# Patient Record
Sex: Male | Born: 1975 | Race: Black or African American | Hispanic: No | Marital: Single | Smoking: Never smoker
Health system: Southern US, Community
[De-identification: ages and names within clinical notes are randomized; demographics above are authoritative.]

## PROBLEM LIST (undated history)

## (undated) DIAGNOSIS — R002 Palpitations: Secondary | ICD-10-CM

## (undated) DIAGNOSIS — F29 Unspecified psychosis not due to a substance or known physiological condition: Secondary | ICD-10-CM

## (undated) DIAGNOSIS — G8929 Other chronic pain: Secondary | ICD-10-CM

## (undated) DIAGNOSIS — F410 Panic disorder [episodic paroxysmal anxiety] without agoraphobia: Secondary | ICD-10-CM

## (undated) DIAGNOSIS — Z6841 Body Mass Index (BMI) 40.0 and over, adult: Secondary | ICD-10-CM

## (undated) DIAGNOSIS — K219 Gastro-esophageal reflux disease without esophagitis: Secondary | ICD-10-CM

## (undated) DIAGNOSIS — J45909 Unspecified asthma, uncomplicated: Secondary | ICD-10-CM

## (undated) DIAGNOSIS — M109 Gout, unspecified: Secondary | ICD-10-CM

## (undated) DIAGNOSIS — F419 Anxiety disorder, unspecified: Secondary | ICD-10-CM

## (undated) DIAGNOSIS — F329 Major depressive disorder, single episode, unspecified: Secondary | ICD-10-CM

## (undated) DIAGNOSIS — L0390 Cellulitis, unspecified: Secondary | ICD-10-CM

## (undated) DIAGNOSIS — F259 Schizoaffective disorder, unspecified: Secondary | ICD-10-CM

## (undated) DIAGNOSIS — F1190 Opioid use, unspecified, uncomplicated: Secondary | ICD-10-CM

## (undated) HISTORY — DX: Gastro-esophageal reflux disease without esophagitis: K21.9

## (undated) HISTORY — DX: Major depressive disorder, single episode, unspecified: F32.9

## (undated) HISTORY — DX: Cellulitis, unspecified: L03.90

## (undated) HISTORY — PX: CHOLECYSTECTOMY: SHX55

## (undated) HISTORY — PX: CARPAL TUNNEL RELEASE: SHX101

## (undated) HISTORY — DX: Panic disorder (episodic paroxysmal anxiety): F41.0

## (undated) HISTORY — DX: Other chronic pain: G89.29

## (undated) HISTORY — DX: Body Mass Index (BMI) 40.0 and over, adult: Z684

## (undated) HISTORY — DX: Palpitations: R00.2

## (undated) HISTORY — DX: Schizoaffective disorder, unspecified: F25.9

---

## 1998-11-23 ENCOUNTER — Emergency Department (HOSPITAL_COMMUNITY): Admission: EM | Admit: 1998-11-23 | Discharge: 1998-11-23 | Payer: Self-pay | Admitting: Emergency Medicine

## 2002-12-21 ENCOUNTER — Emergency Department (HOSPITAL_COMMUNITY): Admission: EM | Admit: 2002-12-21 | Discharge: 2002-12-21 | Payer: Self-pay | Admitting: Emergency Medicine

## 2007-04-06 ENCOUNTER — Emergency Department (HOSPITAL_COMMUNITY): Admission: EM | Admit: 2007-04-06 | Discharge: 2007-04-06 | Payer: Self-pay | Admitting: Emergency Medicine

## 2009-07-10 ENCOUNTER — Emergency Department: Payer: Self-pay | Admitting: Emergency Medicine

## 2011-03-18 LAB — CBC
Hemoglobin: 16.1
RBC: 5.26
WBC: 4.9

## 2011-03-18 LAB — I-STAT 8, (EC8 V) (CONVERTED LAB)
Acid-Base Excess: 2
BUN: 8
Chloride: 104
HCT: 50
Hemoglobin: 17
Operator id: 198171
Potassium: 3.7
Sodium: 138

## 2011-03-18 LAB — POCT I-STAT CREATININE: Creatinine, Ser: 0.8

## 2011-03-18 LAB — DIFFERENTIAL
Lymphs Abs: 1.8
Monocytes Absolute: 0.4
Monocytes Relative: 7
Neutro Abs: 2.4
Neutrophils Relative %: 49

## 2013-05-09 DIAGNOSIS — G5600 Carpal tunnel syndrome, unspecified upper limb: Secondary | ICD-10-CM | POA: Insufficient documentation

## 2013-06-05 ENCOUNTER — Emergency Department (HOSPITAL_COMMUNITY)
Admission: EM | Admit: 2013-06-05 | Discharge: 2013-06-05 | Disposition: A | Discharge status: Home / Self Care | Payer: 59 | Attending: Emergency Medicine | Admitting: Emergency Medicine

## 2013-06-05 ENCOUNTER — Encounter (HOSPITAL_COMMUNITY): Payer: Self-pay | Admitting: Emergency Medicine

## 2013-06-05 DIAGNOSIS — Z79899 Other long term (current) drug therapy: Secondary | ICD-10-CM | POA: Insufficient documentation

## 2013-06-05 DIAGNOSIS — M722 Plantar fascial fibromatosis: Secondary | ICD-10-CM

## 2013-06-05 DIAGNOSIS — F411 Generalized anxiety disorder: Secondary | ICD-10-CM | POA: Insufficient documentation

## 2013-06-05 DIAGNOSIS — J45909 Unspecified asthma, uncomplicated: Secondary | ICD-10-CM | POA: Insufficient documentation

## 2013-06-05 DIAGNOSIS — M79609 Pain in unspecified limb: Secondary | ICD-10-CM

## 2013-06-05 DIAGNOSIS — M7989 Other specified soft tissue disorders: Secondary | ICD-10-CM

## 2013-06-05 DIAGNOSIS — Z880 Allergy status to penicillin: Secondary | ICD-10-CM | POA: Insufficient documentation

## 2013-06-05 DIAGNOSIS — M109 Gout, unspecified: Secondary | ICD-10-CM | POA: Insufficient documentation

## 2013-06-05 HISTORY — DX: Anxiety disorder, unspecified: F41.9

## 2013-06-05 HISTORY — DX: Gout, unspecified: M10.9

## 2013-06-05 HISTORY — DX: Unspecified asthma, uncomplicated: J45.909

## 2013-06-05 MED ORDER — COLCHICINE 0.6 MG PO TABS: 0.6000 mg | ORAL_TABLET | Freq: Every day | ORAL | Status: DC

## 2013-06-05 MED ORDER — MELOXICAM 15 MG PO TABS: 15.0000 mg | ORAL_TABLET | Freq: Every day | ORAL | Status: DC

## 2013-06-05 MED ORDER — KETOROLAC TROMETHAMINE 60 MG/2ML IM SOLN: 60.0000 mg | Freq: Once | INTRAMUSCULAR | Status: DC

## 2013-06-05 MED ORDER — OXYCODONE-ACETAMINOPHEN 5-325 MG PO TABS
1.0000 | ORAL_TABLET | Freq: Once | ORAL | Status: AC
  Administered 2013-06-05: 1 via ORAL
  Filled 2013-06-05: qty 1

## 2013-06-05 NOTE — ED Notes (Signed)
Per pt sts right foot pain and right calf pain. Recent surgery. Pt swelling to foot and redness. Sent here by doctor for possible blood clot.

## 2013-06-05 NOTE — ED Notes (Signed)
Mayra Reel RN spoke to and discharged pt . sts pt sts pain was 4/10.

## 2013-06-05 NOTE — Progress Notes (Signed)
VASCULAR LAB PRELIMINARY  PRELIMINARY  PRELIMINARY  PRELIMINARY  Right lower extremity venous duplex completed.    Preliminary report:  Right:  No evidence of DVT, superficial thrombosis, or Baker's cyst.  P[place]irie Stenberg, RVS 06/05/2013, 5:34 PM

## 2013-06-05 NOTE — ED Provider Notes (Signed)
CSN: [id number]     Arrival date & time 06/05/13  1629 History   First MD Initiated Contact with Patient 06/05/13 2105     Chief Complaint  Patient presents with  . Leg Pain  . Foot Pain   HPI  History provided by the patient. Patient is a 37 year old male with a history of gout and anxiety who presents with complaints of pain to his right foot. Patient reports having increasing right foot pain for the past few days. He is traveling here from [place]. States he has had similar pain in the right foot previously and diagnosed with gout. Patient however was worried for possible clot in the leg. Denies any severe swelling of the lower leg or calf area. Pain does radiate some into the calf. It is worse with movements and walking. Patient states he's been using Norco which has helped with the pain he recently ran out. Patient also states he was given colchicine in the past for gout which seemed to help with those symptoms at that time. Denies any other aggravating or alleviating factors. No other associated symptoms. Denies any chest pain or shortness of breath. No fever, chills or sweats.   Past Medical History  Diagnosis Date  . Gout   . Anxiety   . Asthma    History reviewed. No pertinent past surgical history. History reviewed. No pertinent family history. History  Substance Use Topics  . Smoking status: Unknown If Ever Smoked  . Smokeless tobacco: Not on file  . Alcohol Use: No    Review of Systems  Constitutional: Negative for fever, chills and diaphoresis.  Respiratory: Negative for shortness of breath.   Cardiovascular: Negative for chest pain.  Neurological: Negative for weakness and numbness.  All other systems reviewed and are negative.    Allergies  Penicillins  Home Medications   Current Outpatient Rx  Name  Route  Sig  Dispense  Refill  . albuterol (PROVENTIL HFA;VENTOLIN HFA) 108 (90 BASE) MCG/ACT inhaler   Inhalation   Inhale 1 puff into the lungs every 6 (six)  hours as needed for wheezing or shortness of breath.         Marland Kitchen albuterol (PROVENTIL) (2.5 MG/3ML) 0.083% nebulizer solution   Nebulization   Take 2.5 mg by nebulization every 6 (six) hours as needed for wheezing or shortness of breath.         . alprazolam (XANAX) 2 MG tablet   Oral   Take 2 mg by mouth 3 (three) times daily.         . colchicine 0.6 MG tablet   Oral   Take 0.6 mg by mouth daily.         . diazepam (VALIUM) 10 MG tablet   Oral   Take 20 mg by mouth every 6 (six) hours as needed for anxiety.         Marland Kitchen FLUoxetine (PROZAC) 20 MG capsule   Oral   Take 20 mg by mouth daily.         Marland Kitchen HYDROcodone-acetaminophen (NORCO/VICODIN) 5-325 MG per tablet   Oral   Take 1 tablet by mouth every 6 (six) hours as needed for moderate pain.         . indomethacin (INDOCIN) 25 MG capsule   Oral   Take 25 mg by mouth 2 (two) times daily with a meal.         . ziprasidone (GEODON) 80 MG capsule   Oral   Take 80 mg by  mouth at bedtime.          BP 121/78  Pulse 70  Temp(Src) 98.4 F (36.9 C) (Oral)  Resp 18  Ht 6\' 1"  (1.854 m)  Wt 320 lb (145.151 kg)  BMI 42.23 kg/m2  SpO2 97% Physical Exam  Nursing note and vitals reviewed. Constitutional: He is oriented to person, place, and time. He appears well-developed and well-nourished. No distress.  HENT:  Head: Normocephalic.  Cardiovascular: Normal rate and regular rhythm.   Pulmonary/Chest: Effort normal and breath sounds normal. No respiratory distress. He has no wheezes. He has no rales.  Musculoskeletal:  Tenderness along the plantar surface of the right foot greatest near the heel. There is mild swelling. No erythema or increased warmth. Normal dorsal pedal pulses. Normal sensations in the feet. No significant pain or swelling along the calf or lower leg.  Neurological: He is alert and oriented to person, place, and time.  Skin: Skin is warm.  Psychiatric: He has a normal mood and affect. His behavior is  normal.    ED Course  Procedures   COORDINATION OF CARE:  Nursing notes reviewed. Vital signs reviewed. Initial pt interview and examination performed.   9:28 PM-patient seen and evaluated. Patient well appearing does not appear in severe pain or acute distress. Patient has tenderness along the right plantar surface with mild swelling. No erythema of the foot. No significant calf pain or swelling. Clinically this has not appear worrisome for DVT. Vascular Doppler also without signs of DVT. No increased warmth. Normal pulses and sensation. He reports previously being told he had gout in the foot. At this time I suspect plantar fasciitis. Discussed recommendations for anti-inflammatory treatments. Patient began to request Norco and stated that he needed to take this to help with the rest of his drive back to [place]. I told him that the appropriate treatment is unknown anti-inflammatory steroid pain medicine and I would not prescribe Norco especially if he plan to drive while taking the medicine. Patient became upset and became upset about the long wait in the emergency department and that I would not give him narcotic pain medicine.  Charge nurse was called at patient request to speak with patient. He will still be discharged with prescription for Motrin.  Treatment plan initiated: Medications  oxyCODONE-acetaminophen (PERCOCET/ROXICET) 5-325 MG per tablet 1 tablet (1 tablet Oral Given 06/05/13 1651)      MDM   1. Plantar fasciitis of right foot        Angus Seller, PA-C 06/06/13 0015

## 2013-06-06 NOTE — ED Provider Notes (Signed)
Medical screening examination/treatment/procedure(s) were performed by non-physician practitioner and as supervising physician I was immediately available for consultation/collaboration.  EKG Interpretation   None        Leisha Trinkle R. Thoren Hosang, MD 06/06/13 2301 

## 2016-06-06 ENCOUNTER — Emergency Department: Payer: 59

## 2016-06-06 ENCOUNTER — Encounter: Payer: Self-pay | Admitting: Emergency Medicine

## 2016-06-06 ENCOUNTER — Emergency Department
Admission: EM | Admit: 2016-06-06 | Discharge: 2016-06-06 | Disposition: A | Discharge status: Home / Self Care | Payer: 59 | Attending: Emergency Medicine | Admitting: Emergency Medicine

## 2016-06-06 DIAGNOSIS — R197 Diarrhea, unspecified: Secondary | ICD-10-CM | POA: Diagnosis not present

## 2016-06-06 DIAGNOSIS — R112 Nausea with vomiting, unspecified: Secondary | ICD-10-CM

## 2016-06-06 DIAGNOSIS — Z79899 Other long term (current) drug therapy: Secondary | ICD-10-CM | POA: Insufficient documentation

## 2016-06-06 DIAGNOSIS — J45909 Unspecified asthma, uncomplicated: Secondary | ICD-10-CM | POA: Diagnosis not present

## 2016-06-06 DIAGNOSIS — E876 Hypokalemia: Secondary | ICD-10-CM | POA: Diagnosis not present

## 2016-06-06 DIAGNOSIS — R509 Fever, unspecified: Secondary | ICD-10-CM | POA: Diagnosis present

## 2016-06-06 DIAGNOSIS — K2960 Other gastritis without bleeding: Secondary | ICD-10-CM | POA: Insufficient documentation

## 2016-06-06 DIAGNOSIS — K2900 Acute gastritis without bleeding: Secondary | ICD-10-CM

## 2016-06-06 LAB — CBC
HEMATOCRIT: 47 % (ref 40.0–52.0)
HEMOGLOBIN: 16.1 g/dL (ref 13.0–18.0)
MCH: 29.9 pg (ref 26.0–34.0)
MCHC: 34.2 g/dL (ref 32.0–36.0)
MCV: 87.3 fL (ref 80.0–100.0)
Platelets: 253 10*3/uL (ref 150–440)
RBC: 5.38 MIL/uL (ref 4.40–5.90)
RDW: 13.4 % (ref 11.5–14.5)
WBC: 8.8 10*3/uL (ref 3.8–10.6)

## 2016-06-06 LAB — COMPREHENSIVE METABOLIC PANEL
ALK PHOS: 89 U/L (ref 38–126)
ALT: 56 U/L (ref 17–63)
ANION GAP: 8 (ref 5–15)
AST: 41 U/L (ref 15–41)
Albumin: 3.8 g/dL (ref 3.5–5.0)
BILIRUBIN TOTAL: 0.8 mg/dL (ref 0.3–1.2)
BUN: 16 mg/dL (ref 6–20)
CALCIUM: 8.3 mg/dL — AB (ref 8.9–10.3)
CO2: 27 mmol/L (ref 22–32)
CREATININE: 1.01 mg/dL (ref 0.61–1.24)
Chloride: 103 mmol/L (ref 101–111)
Glucose, Bld: 142 mg/dL — ABNORMAL HIGH (ref 65–99)
Potassium: 2.9 mmol/L — ABNORMAL LOW (ref 3.5–5.1)
SODIUM: 138 mmol/L (ref 135–145)
TOTAL PROTEIN: 7.3 g/dL (ref 6.5–8.1)

## 2016-06-06 LAB — LIPASE, BLOOD: LIPASE: 30 U/L (ref 11–51)

## 2016-06-06 LAB — INFLUENZA PANEL BY PCR (TYPE A & B)
INFLAPCR: NEGATIVE
Influenza B By PCR: NEGATIVE

## 2016-06-06 LAB — TROPONIN I

## 2016-06-06 MED ORDER — SODIUM CHLORIDE 0.9 % IV BOLUS (SEPSIS)
1000.0000 mL | Freq: Once | INTRAVENOUS | Status: AC
  Administered 2016-06-06: 1000 mL via INTRAVENOUS

## 2016-06-06 MED ORDER — POTASSIUM CHLORIDE IN NACL 20-0.9 MEQ/L-% IV SOLN
Freq: Once | INTRAVENOUS | Status: DC
  Filled 2016-06-06: qty 1000

## 2016-06-06 MED ORDER — POTASSIUM CHLORIDE 2 MEQ/ML IV SOLN: Freq: Once | INTRAVENOUS | Status: DC

## 2016-06-06 MED ORDER — SUCRALFATE 1 G PO TABS
1.0000 g | ORAL_TABLET | Freq: Once | ORAL | Status: AC
  Administered 2016-06-06: 1 g via ORAL
  Filled 2016-06-06: qty 1

## 2016-06-06 MED ORDER — SUCRALFATE 1 G PO TABS: 1.0000 g | ORAL_TABLET | Freq: Two times a day (BID) | ORAL | 0 refills | Status: DC

## 2016-06-06 MED ORDER — METOCLOPRAMIDE HCL 5 MG/ML IJ SOLN
10.0000 mg | Freq: Once | INTRAMUSCULAR | Status: AC
  Administered 2016-06-06: 10 mg via INTRAVENOUS

## 2016-06-06 MED ORDER — METOCLOPRAMIDE HCL 5 MG/ML IJ SOLN
INTRAMUSCULAR | Status: AC
  Administered 2016-06-06: 10 mg via INTRAVENOUS
  Filled 2016-06-06: qty 2

## 2016-06-06 MED ORDER — METOCLOPRAMIDE HCL 10 MG PO TABS: 10.0000 mg | ORAL_TABLET | Freq: Once | ORAL | Status: DC

## 2016-06-06 MED ORDER — ONDANSETRON HCL 4 MG/2ML IJ SOLN
4.0000 mg | Freq: Once | INTRAMUSCULAR | Status: AC
  Administered 2016-06-06: 4 mg via INTRAVENOUS
  Filled 2016-06-06: qty 2

## 2016-06-06 MED ORDER — POTASSIUM CHLORIDE IN NACL 40-0.9 MEQ/L-% IV SOLN
Freq: Once | INTRAVENOUS | Status: AC
  Administered 2016-06-06: 250 mL/h via INTRAVENOUS
  Filled 2016-06-06: qty 1000

## 2016-06-06 MED ORDER — ONDANSETRON 4 MG PO TBDP: 4.0000 mg | ORAL_TABLET | Freq: Three times a day (TID) | ORAL | 0 refills | Status: DC | PRN

## 2016-06-06 NOTE — ED Notes (Signed)
Patient heard wrenching from the room. RN to bedside. Patient is not producing any emesis. MD aware. VORB for Metoclopramide 10mg  IVP. [facility] and carried by emergency department nursing staff.

## 2016-06-06 NOTE — ED Notes (Signed)
Patient returned to ED 19 from radiology at this time. MD with order in for flu swab. Specimen to be collected by ED nursing staff and sent for testing. MD aware that patient is back in the department; awaiting CXR results.

## 2016-06-06 NOTE — ED Notes (Signed)
VSFS updated. Zofran given. Patient provided with warm blankets per request. No further verbalized needs. Patient pending discharge as soon as MD provided paperwork. Patient and family aware of discharge POC.

## 2016-06-06 NOTE — ED Triage Notes (Signed)
Pt in with co n.v.d x 3 days, actively vomiting in triage.

## 2016-06-06 NOTE — ED Provider Notes (Signed)
[facility] Emergency Department Provider Note   ____________________________________________   First MD Initiated Contact with Patient 06/06/16 0131     (approximate)  I have reviewed the triage vital signs and the nursing notes.   HISTORY  Chief Complaint Emesis    HPI Martin Bruce is a 40 y.o. male who comes into the hospital today with some vomiting, chest pain, headache and chills. The patient reports that he's had these symptoms all week. He reports that he is unsure if he's had a temperature because he hasn't checked his temperature but has had hot flashes and sweats. He reports that his emesis has been yellow-looking. He has pain all over his abdomen. He reports that he's had diarrhea as well but none today. The patient denies any sick contacts and rates his pain a 10 out of 10 in intensity currently. The patient decided to come in today because he couldn't keep anything down and he wanted to get checked out.   Past Medical History:  Diagnosis Date  . Anxiety   . Asthma   . Gout     There are no active problems to display for this patient.   Past Surgical History:  Procedure Laterality Date  . CHOLECYSTECTOMY      Prior to Admission medications   Medication Sig Start Date End Date Taking? Authorizing Provider  albuterol (PROVENTIL HFA;VENTOLIN HFA) 108 (90 BASE) MCG/ACT inhaler Inhale 1 puff into the lungs every 6 (six) hours as needed for wheezing or shortness of breath.    Historical Provider, MD  albuterol (PROVENTIL) (2.5 MG/3ML) 0.083% nebulizer solution Take 2.5 mg by nebulization every 6 (six) hours as needed for wheezing or shortness of breath.    Historical Provider, MD  alprazolam Prudy Feeler(XANAX) 2 MG tablet Take 2 mg by mouth 3 (three) times daily.    Historical Provider, MD  colchicine 0.6 MG tablet Take 0.6 mg by mouth daily.    Historical Provider, MD  colchicine 0.6 MG tablet Take 1 tablet (0.6 mg total) by mouth daily. 06/05/13    Ivonne AndrewPeter Dammen, PA-C  diazepam (VALIUM) 10 MG tablet Take 20 mg by mouth every 6 (six) hours as needed for anxiety.    Historical Provider, MD  FLUoxetine (PROZAC) 20 MG capsule Take 20 mg by mouth daily.    Historical Provider, MD  HYDROcodone-acetaminophen (NORCO/VICODIN) 5-325 MG per tablet Take 1 tablet by mouth every 6 (six) hours as needed for moderate pain.    Historical Provider, MD  indomethacin (INDOCIN) 25 MG capsule Take 25 mg by mouth 2 (two) times daily with a meal.    Historical Provider, MD  meloxicam (MOBIC) 15 MG tablet Take 1 tablet (15 mg total) by mouth daily. 06/05/13   Peter Dammen, PA-C  ondansetron (ZOFRAN ODT) 4 MG disintegrating tablet Take 1 tablet (4 mg total) by mouth every 8 (eight) hours as needed for nausea or vomiting. 06/06/16   Rebecka ApleyAllison P Kambra Beachem, MD  sucralfate (CARAFATE) 1 g tablet Take 1 tablet (1 g total) by mouth 2 (two) times daily. 06/06/16   Rebecka ApleyAllison P Betsey Sossamon, MD  ziprasidone (GEODON) 80 MG capsule Take 80 mg by mouth at bedtime.    Historical Provider, MD    Allergies Penicillins  No family history on file.  Social History Social History  Substance Use Topics  . Smoking status: Unknown If Ever Smoked  . Smokeless tobacco: Not on file  . Alcohol use No    Review of Systems Constitutional: No fever/chills  Eyes: No visual changes. ENT: No sore throat. Cardiovascular: Denies chest pain. Respiratory: Denies shortness of breath. Gastrointestinal:  abdominal pain, nausea, vomiting. diarrhea.  No constipation. Genitourinary: Negative for dysuria. Musculoskeletal: Negative for back pain. Skin: Negative for rash. Neurological: Negative for headaches, focal weakness or numbness.  10-point ROS otherwise negative.  ____________________________________________   PHYSICAL EXAM:  VITAL SIGNS: ED Triage Vitals  Enc Vitals Group     BP 06/06/16 0048 (!) 157/110     Pulse Rate 06/06/16 0048 75     Resp 06/06/16 0048 20     Temp 06/06/16 0048  98.9 F (37.2 C)     Temp Source 06/06/16 0048 Oral     SpO2 06/06/16 0048 99 %     Weight 06/06/16 0047 (!) 350 lb (158.8 kg)     Height 06/06/16 0047 6' (1.829 m)     Head Circumference --      Peak Flow --      Pain Score 06/06/16 0047 10     Pain Loc --      Pain Edu? --      Excl. in GC? --     Constitutional: Alert and oriented. Well appearing and in Moderate distress. Eyes: Conjunctivae are normal. PERRL. EOMI. Head: Atraumatic. Nose: No congestion/rhinnorhea. Mouth/Throat: Mucous membranes are moist.  Oropharynx non-erythematous. Cardiovascular: Normal rate, regular rhythm. Grossly normal heart sounds.  Good peripheral circulation. Respiratory: Normal respiratory effort.  No retractions. Lungs CTAB. Gastrointestinal: Soft with some diffuse tenderness to palpation, No distention. Positive bowel sounds Musculoskeletal: No lower extremity tenderness nor edema.   Neurologic:  Normal speech and language.  Skin:  Skin is warm, dry and intact. Psychiatric: Mood and affect are normal.   ____________________________________________   LABS (all labs ordered are listed, but only abnormal results are displayed)  Labs Reviewed  COMPREHENSIVE METABOLIC PANEL - Abnormal; Notable for the following:       Result Value   Potassium 2.9 (*)    Glucose, Bld 142 (*)    Calcium 8.3 (*)    All other components within normal limits  CBC  LIPASE, BLOOD  TROPONIN I  INFLUENZA PANEL BY PCR (TYPE A & B, H1N1)   ____________________________________________  EKG  ED ECG REPORT I, Rebecka ApleyWebster,  Jadine Brumley P, the attending physician, personally viewed and interpreted this ECG.   Date: 06/06/2016  EKG Time: 102  Rate: 63  Rhythm: normal sinus rhythm  Axis: normal  Intervals:none  ST&T Change: none  ____________________________________________  RADIOLOGY  none ____________________________________________   PROCEDURES  Procedure(s) performed: None  Procedures  Critical Care  performed: No  ____________________________________________   INITIAL IMPRESSION / ASSESSMENT AND PLAN / ED COURSE  Pertinent labs & imaging results that were available during my care of the patient were reviewed by me and considered in my medical decision making (see chart for details).  This is a 40 year old male who comes into the hospital today with some vomiting and diarrhea as well as abdominal pain that's been going on for the past week. I will give the patient a dose of Zofran as well as some normal saline. I will check some blood work and then reassess the patient. If he continues to have some abdominal pain I will check a CT of his abdomen.  Clinical Course as of Jun 06 757  Sat Jun 06, 2016  0307 No active cardiopulmonary disease. DG Chest 2 View [AW]    Clinical Course User Index [AW] Rebecka ApleyAllison P Octaviano Mukai, MD  I did go back and check the patient. He was laying on the stretcher without any acute distress. He did receive a liter of normal saline with 40 MEQ of potassium. The patient reports that he was still feeling nauseous and he still had pain. I informed him that I wouldn't do a CT that he would have to drink 2 bottles of contrast. The patient has also been resting without any distress. The patient states that he does not want to drink any of the contrast. I asked what he would like to do and the patient stated he wanted to go home. The patient be discharged home with some medicine. His blood work is unremarkable. He is been instructed to return if any symptoms worsen.  ____________________________________________   FINAL CLINICAL IMPRESSION(S) / ED DIAGNOSES  Final diagnoses:  Nausea vomiting and diarrhea  Other acute gastritis without hemorrhage  Hypokalemia      NEW MEDICATIONS STARTED DURING THIS VISIT:  New Prescriptions   ONDANSETRON (ZOFRAN ODT) 4 MG DISINTEGRATING TABLET    Take 1 tablet (4 mg total) by mouth every 8 (eight) hours as needed for nausea or  vomiting.   SUCRALFATE (CARAFATE) 1 G TABLET    Take 1 tablet (1 g total) by mouth 2 (two) times daily.     Note:  This document was prepared using Dragon voice recognition software and may include unintentional dictation errors.    Rebecka Apley, MD 06/06/16 0800

## 2016-06-06 NOTE — ED Notes (Signed)
Dr. Zenda AlpersWebster to bedside. Patient reports that he continues to feel nauseated despite interventions thus far. MD offered CT scan of the abdomen with contrast; patient declined. Patient requesting an "intravenous" dose of medication for nausea prior to discharge. MD offered ODT Zofran, citing that this is what patient would be discharged on. Patient adamant about getting an IV dose of medication. MD with VORB for Zofran 4mg  IVP; [facility] by [facility] department nursing staff.

## 2016-06-06 NOTE — ED Notes (Signed)
Pt verbalized understanding of discharge instructions. NAD at this time. 

## 2016-06-06 NOTE — ED Notes (Signed)
MD with VORB for 0.9% NS with Potassium to treat patient's hypokalemia; K+ is 2.9 at present via lab report from his CMP. Order entered by this RN. Previous MD order discontinued for NS with of Potassium.

## 2016-06-06 NOTE — ED Notes (Signed)
Patient to radiology at this time for MD ordered 2 view CXR.  

## 2016-06-06 NOTE — Discharge Instructions (Signed)
Her blood work and evaluation is unremarkable. We discussed doing a CT scan given the continued nausea but at this time you have declined a CT scan. I will give us a medication for home but should the pain persist or you have any worsening symptoms over the next 2-3 days please return to the emergency department for further evaluation.

## 2016-06-12 DIAGNOSIS — G8929 Other chronic pain: Secondary | ICD-10-CM | POA: Insufficient documentation

## 2016-06-12 DIAGNOSIS — M25512 Pain in left shoulder: Secondary | ICD-10-CM | POA: Insufficient documentation

## 2016-06-12 DIAGNOSIS — J4541 Moderate persistent asthma with (acute) exacerbation: Secondary | ICD-10-CM | POA: Insufficient documentation

## 2017-08-26 ENCOUNTER — Other Ambulatory Visit: Payer: Self-pay

## 2017-08-26 ENCOUNTER — Emergency Department (HOSPITAL_COMMUNITY)
Admission: EM | Admit: 2017-08-26 | Discharge: 2017-08-26 | Disposition: A | Discharge status: Home / Self Care | Payer: 59 | Attending: Emergency Medicine | Admitting: Emergency Medicine

## 2017-08-26 ENCOUNTER — Encounter (HOSPITAL_COMMUNITY): Payer: Self-pay

## 2017-08-26 DIAGNOSIS — Y998 Other external cause status: Secondary | ICD-10-CM | POA: Diagnosis not present

## 2017-08-26 DIAGNOSIS — W57XXXA Bitten or stung by nonvenomous insect and other nonvenomous arthropods, initial encounter: Secondary | ICD-10-CM | POA: Diagnosis not present

## 2017-08-26 DIAGNOSIS — Z79899 Other long term (current) drug therapy: Secondary | ICD-10-CM | POA: Diagnosis not present

## 2017-08-26 DIAGNOSIS — S70361A Insect bite (nonvenomous), right thigh, initial encounter: Secondary | ICD-10-CM | POA: Insufficient documentation

## 2017-08-26 DIAGNOSIS — J209 Acute bronchitis, unspecified: Secondary | ICD-10-CM

## 2017-08-26 DIAGNOSIS — Z7722 Contact with and (suspected) exposure to environmental tobacco smoke (acute) (chronic): Secondary | ICD-10-CM | POA: Insufficient documentation

## 2017-08-26 DIAGNOSIS — Y929 Unspecified place or not applicable: Secondary | ICD-10-CM | POA: Insufficient documentation

## 2017-08-26 DIAGNOSIS — J45901 Unspecified asthma with (acute) exacerbation: Secondary | ICD-10-CM | POA: Insufficient documentation

## 2017-08-26 DIAGNOSIS — Y939 Activity, unspecified: Secondary | ICD-10-CM | POA: Diagnosis not present

## 2017-08-26 DIAGNOSIS — R0602 Shortness of breath: Secondary | ICD-10-CM | POA: Diagnosis present

## 2017-08-26 LAB — BASIC METABOLIC PANEL
Anion gap: 10 (ref 5–15)
BUN: 15 mg/dL (ref 6–20)
CALCIUM: 9.1 mg/dL (ref 8.9–10.3)
CHLORIDE: 101 mmol/L (ref 101–111)
CO2: 25 mmol/L (ref 22–32)
CREATININE: 0.9 mg/dL (ref 0.61–1.24)
GFR calc Af Amer: >60 mL/min (ref >60)
GFR calc non Af Amer: >60 mL/min (ref >60)
Glucose, Bld: 117 mg/dL — ABNORMAL HIGH (ref 65–99)
Potassium: 3.5 mmol/L (ref 3.5–5.1)
Sodium: 136 mmol/L (ref 135–145)

## 2017-08-26 LAB — MAGNESIUM: Magnesium: 1.9 mg/dL (ref 1.7–2.4)

## 2017-08-26 MED ORDER — ALBUTEROL SULFATE (2.5 MG/3ML) 0.083% IN NEBU
5.0000 mg | INHALATION_SOLUTION | Freq: Once | RESPIRATORY_TRACT | Status: AC
  Administered 2017-08-26: 5 mg via RESPIRATORY_TRACT
  Filled 2017-08-26: qty 6

## 2017-08-26 MED ORDER — IPRATROPIUM BROMIDE 0.02 % IN SOLN
0.5000 mg | Freq: Once | RESPIRATORY_TRACT | Status: AC
  Administered 2017-08-26: 0.5 mg via RESPIRATORY_TRACT
  Filled 2017-08-26: qty 2.5

## 2017-08-26 MED ORDER — CYCLOBENZAPRINE HCL 10 MG PO TABS
10.0000 mg | ORAL_TABLET | Freq: Once | ORAL | Status: AC
  Administered 2017-08-26: 10 mg via ORAL
  Filled 2017-08-26: qty 1

## 2017-08-26 MED ORDER — DOXYCYCLINE HYCLATE 100 MG PO CAPS: 100.0000 mg | ORAL_CAPSULE | Freq: Two times a day (BID) | ORAL | 0 refills | Status: DC

## 2017-08-26 MED ORDER — PREDNISONE 20 MG PO TABS: ORAL_TABLET | ORAL | 0 refills | Status: DC

## 2017-08-26 NOTE — Discharge Instructions (Addendum)
You need to get a primary care doctor and a psychiatrist. Continue your inhaler and nebulizer. Take the medications as prescribed. Use heat on the area on your thigh. Recheck if you get a fever or seem worse.

## 2017-08-26 NOTE — ED Provider Notes (Signed)
[facility] EMERGENCY DEPARTMENT Provider Note   CSN: [id number] Arrival date & time: 08/26/17  0151  Time seen 02:25 AM   History   Chief Complaint Chief Complaint  Patient presents with  . Shortness of Breath    HPI Martin Bruce is a 42 y.o. male.  HPI patient states about 2 PM he started feeling short of breath.  He describes cough with yellow sputum production and wheezing.  He states he has had wheezing in the past.  He states he used his inhaler which did not help and the nebulizer only helped minimally.  He denies fever or chills.  He states he had some posttussive vomiting and some vomiting x3.  He denies diarrhea.  He denies chest pain but states he has some cramping abdominal pain bilaterally in his abdomen that is gone now.  He has had some sore throat, he states he had some rhinorrhea earlier that was clear, he denies sneezing.  He states his chest feels heavy, he states laying flat makes the breathing worse, sitting forward and Fowlers position makes it feel better.  He states he is never had to be admitted for asthma.  He has been on steroids in the past.  He also states he has a "spider bite" on his right inner thigh.  PCP System, Pcp Not In   Past Medical History:  Diagnosis Date  . Anxiety   . Asthma   . Gout     There are no active problems to display for this patient.   Past Surgical History:  Procedure Laterality Date  . CHOLECYSTECTOMY         Home Medications    Prior to Admission medications   Medication Sig Start Date End Date Taking? Authorizing Provider  albuterol (PROVENTIL HFA;VENTOLIN HFA) 108 (90 BASE) MCG/ACT inhaler Inhale 1 puff into the lungs every 6 (six) hours as needed for wheezing or shortness of breath.   Yes [provider]  albuterol (PROVENTIL) (2.5 MG/3ML) 0.083% nebulizer solution Take 2.5 mg by nebulization every 6 (six) hours as needed for wheezing or shortness of breath.   Yes [provider]    alprazolam Prudy Feeler(XANAX) 2 MG tablet Take 2 mg by mouth 3 (three) times daily.   Yes [provider]  colchicine 0.6 MG tablet Take 0.6 mg by mouth daily.   Yes [provider]  colchicine 0.6 MG tablet Take 1 tablet (0.6 mg total) by mouth daily. 06/05/13  Yes Dammen, Theron AristaPeter, PA-C  diazepam (VALIUM) 10 MG tablet Take 20 mg by mouth every 6 (six) hours as needed for anxiety.   Yes [provider]  FLUoxetine (PROZAC) 20 MG capsule Take 20 mg by mouth daily.   Yes [provider]  HYDROcodone-acetaminophen (NORCO/VICODIN) 5-325 MG per tablet Take 1 tablet by mouth every 6 (six) hours as needed for moderate pain.   Yes [provider]  meloxicam (MOBIC) 15 MG tablet Take 1 tablet (15 mg total) by mouth daily. 06/05/13  Yes Dammen, Theron AristaPeter, PA-C  sucralfate (CARAFATE) 1 g tablet Take 1 tablet (1 g total) by mouth 2 (two) times daily. 06/06/16  Yes Rebecka ApleyWebster, Allison P, MD  ziprasidone (GEODON) 80 MG capsule Take 80 mg by mouth at bedtime.   Yes [provider]  doxycycline (VIBRAMYCIN) 100 MG capsule Take 1 capsule (100 mg total) by mouth 2 (two) times daily. 08/26/17   Devoria AlbeKnapp, Maddax Palinkas, MD  indomethacin (INDOCIN) 25 MG capsule Take 25 mg by mouth 2 (two)  times daily with a meal.    [provider]  ondansetron (ZOFRAN ODT) 4 MG disintegrating tablet Take 1 tablet (4 mg total) by mouth every 8 (eight) hours as needed for nausea or vomiting. 06/06/16   Rebecka Apley, MD  predniSONE (DELTASONE) 20 MG tablet Take 3 po QD x 3d , then 2 po QD x 3d then 1 po QD x 3d 08/26/17   Devoria Albe, MD    Family History No family history on file.  Social History Social History   Tobacco Use  . Smoking status: Passive Smoke Exposure - Never Smoker  . Smokeless tobacco: Never Used  Substance Use Topics  . Alcohol use: No  . Drug use: Yes    Types: Marijuana    Comment: medical marijuana  pt is on disability Moved from [place] 1-2 months ago   Allergies    Penicillins   Review of Systems Review of Systems  All other systems reviewed and are negative.    Physical Exam Updated Vital Signs BP 110/84   Pulse 73   Resp 20   Ht 6\' 2"  (1.88 m)   Wt (!) 165.6 kg (365 lb)   SpO2 97%   BMI 46.86 kg/m   Vital signs normal    Physical Exam  Constitutional: He is oriented to person, place, and time. He appears well-developed and well-nourished.  Non-toxic appearance. He does not appear ill. No distress.  obese  HENT:  Head: Normocephalic and atraumatic.  Right Ear: External ear normal.  Left Ear: External ear normal.  Nose: Nose normal. No mucosal edema or rhinorrhea.  Mouth/Throat: Oropharynx is clear and moist and mucous membranes are normal. No dental abscesses or uvula swelling.  Eyes: Pupils are equal, round, and reactive to light. Conjunctivae and EOM are normal.  Neck: Normal range of motion and full passive range of motion without pain. Neck supple.  Cardiovascular: Normal rate, regular rhythm and normal heart sounds. Exam reveals no gallop and no friction rub.  No murmur heard. Pulmonary/Chest: Effort normal. No respiratory distress. He has decreased breath sounds. He has wheezes. He has no rhonchi. He has no rales. He exhibits no tenderness and no crepitus.    Abdominal: Soft. Normal appearance and bowel sounds are normal. He exhibits no distension. There is no tenderness. There is no rebound and no guarding.  Musculoskeletal: Normal range of motion. He exhibits no edema or tenderness.  Moves all extremities well.   Neurological: He is alert and oriented to person, place, and time. He has normal strength. No cranial nerve deficit.  Skin: Skin is warm, dry and intact. No rash noted. No erythema. No pallor.  Psychiatric: He has a normal mood and affect. His speech is normal and behavior is normal. His mood appears not anxious.  Nursing note and vitals reviewed.    ED Treatments / Results  Labs (all labs ordered are  listed, but only abnormal results are displayed) Results for orders placed or performed during the hospital encounter of 08/26/17  Basic metabolic panel  Result Value Ref Range   Sodium 136 135 - 145 mmol/L   Potassium 3.5 3.5 - 5.1 mmol/L   Chloride 101 101 - 111 mmol/L   CO2 25 22 - 32 mmol/L   Glucose, Bld 117 (H) 65 - 99 mg/dL   BUN 15 6 - 20 mg/dL   Creatinine, Ser 1.61 0.61 - 1.24 mg/dL   Calcium 9.1 8.9 - 09.6 mg/dL   GFR calc non Af Amer >  60 >60 mL/min   GFR calc Af Amer >60 >60 mL/min   Anion gap 10 5 - 15  Magnesium  Result Value Ref Range   Magnesium 1.9 1.7 - 2.4 mg/dL   Laboratory interpretation all normal except hyperglycemia (nonfasting)   EKG  EKG Interpretation None     ED ECG REPORT   Date: 08/26/2017  Rate: 80  Rhythm: normal sinus rhythm and sinus arrhythmia  QRS Axis: normal  Intervals: normal  ST/T Wave abnormalities: normal  Conduction Disutrbances:none  Narrative Interpretation:   Old EKG Reviewed: none available  I have personally reviewed the EKG tracing and agree with the computerized printout as noted.   Radiology No results found.  Procedures Procedures (including critical care time)  Medications Ordered in ED Medications  ipratropium (ATROVENT) nebulizer solution 0.5 mg (0.5 mg Nebulization Given 08/26/17 0408)  albuterol (PROVENTIL) (2.5 MG/3ML) 0.083% nebulizer solution 5 mg (5 mg Nebulization Given 08/26/17 0408)  albuterol (PROVENTIL) (2.5 MG/3ML) 0.083% nebulizer solution 5 mg (5 mg Nebulization Given 08/26/17 0546)  ipratropium (ATROVENT) nebulizer solution 0.5 mg (0.5 mg Nebulization Given 08/26/17 0546)  cyclobenzaprine (FLEXERIL) tablet 10 mg (10 mg Oral Given 08/26/17 0514)     Initial Impression / Assessment and Plan / ED Course  I have reviewed the triage vital signs and the nursing notes.  Pertinent labs & imaging results that were available during my care of the patient were reviewed by me and considered in my medical  decision making (see chart for details).     Patient was given a albuterol and Atrovent nebulizer treatment.  Patient was rechecked at 5 AM.  He states she is feeling better, there is less coughing coming from his room.  However when I listen to his lungs and he took a big deep breath he started getting cramping in his right chest wall.  I added blood work to include a potassium, calcium, and magnesium, either of these if they were too low would cause muscle spasms or cramping.  When I listen to his lungs the wheezing was gone.  He still had some diminished breath sounds.  He was given a second nebulizer.  I also looked at the area on his leg that he thought was a bug bite.  There is an area on his right inner thigh about halfway up his leg that has a area that looks like it has been draining.  When I palpate it there is no induration at all, so there is no abscess present.  He states it has been draining.  Patient's calcium, magnesium, and potassium were all normal.  He was given Flexeril for the muscle spasms he had in the ED.  Patient was discharged home on doxycycline will which will help his respiratory symptoms and this area on his leg.  He was also discharged home on steroids.  He has a inhaler nebulizer he can use at home.  Final Clinical Impressions(s) / ED Diagnoses   Final diagnoses:  Bronchitis with bronchospasm  Insect bite, initial encounter    ED Discharge Orders        Ordered    doxycycline (VIBRAMYCIN) 100 MG capsule  2 times daily     08/26/17 0553    predniSONE (DELTASONE) 20 MG tablet     08/26/17 0553      Plan discharge  Devoria Albe, MD, Concha Pyo, MD 08/26/17 [phone]

## 2017-08-26 NOTE — ED Triage Notes (Signed)
Pt arrived via POV from Home stating he has not been able to rest since 1700 yesterday and has been SOB. Pt is wheezing on expiration and states he tried his albuterol nebulizer at home without success.

## 2017-08-26 NOTE — ED Notes (Addendum)
Pt c/o cramping in right flank and Restless Leg Syndrome and asking for something to relax. RN explained the Flexeril he had just received may take a little while to set in, but would help him relax.

## 2018-12-26 ENCOUNTER — Other Ambulatory Visit: Payer: Self-pay

## 2018-12-26 ENCOUNTER — Emergency Department (HOSPITAL_COMMUNITY)
Admission: EM | Admit: 2018-12-26 | Discharge: 2018-12-26 | Disposition: A | Discharge status: Home / Self Care | Payer: 59 | Attending: Emergency Medicine | Admitting: Emergency Medicine

## 2018-12-26 ENCOUNTER — Encounter (HOSPITAL_COMMUNITY): Payer: Self-pay

## 2018-12-26 DIAGNOSIS — Z7722 Contact with and (suspected) exposure to environmental tobacco smoke (acute) (chronic): Secondary | ICD-10-CM | POA: Diagnosis not present

## 2018-12-26 DIAGNOSIS — Z79899 Other long term (current) drug therapy: Secondary | ICD-10-CM | POA: Insufficient documentation

## 2018-12-26 DIAGNOSIS — M79675 Pain in left toe(s): Secondary | ICD-10-CM | POA: Diagnosis present

## 2018-12-26 DIAGNOSIS — J45909 Unspecified asthma, uncomplicated: Secondary | ICD-10-CM | POA: Insufficient documentation

## 2018-12-26 DIAGNOSIS — M109 Gout, unspecified: Secondary | ICD-10-CM | POA: Insufficient documentation

## 2018-12-26 MED ORDER — HYDROCODONE-ACETAMINOPHEN 5-325 MG PO TABS
1.0000 | ORAL_TABLET | Freq: Once | ORAL | Status: AC
  Administered 2018-12-26: 1 via ORAL
  Filled 2018-12-26: qty 1

## 2018-12-26 MED ORDER — PREDNISONE 50 MG PO TABS: ORAL_TABLET | ORAL | 0 refills | Status: DC

## 2018-12-26 MED ORDER — HYDROCODONE-ACETAMINOPHEN 5-325 MG PO TABS: 1.0000 | ORAL_TABLET | ORAL | 0 refills | Status: DC | PRN

## 2018-12-26 MED ORDER — PREDNISONE 50 MG PO TABS
60.0000 mg | ORAL_TABLET | Freq: Once | ORAL | Status: AC
  Administered 2018-12-26: 60 mg via ORAL
  Filled 2018-12-26: qty 1

## 2018-12-26 MED ORDER — IBUPROFEN 800 MG PO TABS
800.0000 mg | ORAL_TABLET | Freq: Once | ORAL | Status: AC
  Administered 2018-12-26: 800 mg via ORAL
  Filled 2018-12-26: qty 1

## 2018-12-26 NOTE — ED Provider Notes (Signed)
[facility] EMERGENCY DEPARTMENT Provider Note   CSN: [id number] Arrival date & time: 12/26/18  2005     History   Chief Complaint No chief complaint on file.   HPI Martin Bruce is a 43 y.o. male.     The history is provided by the patient. No language interpreter was used.  Toe Pain This is a new problem. The problem occurs constantly. The problem has been gradually worsening. Nothing aggravates the symptoms. Nothing relieves the symptoms. He has tried nothing for the symptoms. The treatment provided no relief.  Pt complains of swelling and pain to left 1st toe  Past Medical History:  Diagnosis Date  . Anxiety   . Asthma   . Gout     There are no active problems to display for this patient.   Past Surgical History:  Procedure Laterality Date  . CHOLECYSTECTOMY          Home Medications    Prior to Admission medications   Medication Sig Start Date End Date Taking? Authorizing Provider  albuterol (PROVENTIL HFA;VENTOLIN HFA) 108 (90 BASE) MCG/ACT inhaler Inhale 1 puff into the lungs every 6 (six) hours as needed for wheezing or shortness of breath.    [provider]  albuterol (PROVENTIL) (2.5 MG/3ML) 0.083% nebulizer solution Take 2.5 mg by nebulization every 6 (six) hours as needed for wheezing or shortness of breath.    [provider]  alprazolam Duanne Moron) 2 MG tablet Take 2 mg by mouth 3 (three) times daily.    [provider]  colchicine 0.6 MG tablet Take 0.6 mg by mouth daily.    [provider]  colchicine 0.6 MG tablet Take 1 tablet (0.6 mg total) by mouth daily. 06/05/13   Hazel Sams, PA-C  diazepam (VALIUM) 10 MG tablet Take 20 mg by mouth every 6 (six) hours as needed for anxiety.    [provider]  doxycycline (VIBRAMYCIN) 100 MG capsule Take 1 capsule (100 mg total) by mouth 2 (two) times daily. 08/26/17   Rolland Porter, MD  FLUoxetine (PROZAC) 20 MG capsule Take 20 mg by mouth daily.    [provider]  HYDROcodone-acetaminophen (NORCO/VICODIN) 5-325 MG tablet Take 1 tablet by mouth every 4 (four) hours as needed. 12/26/18   Fransico Meadow, PA-C  ondansetron (ZOFRAN ODT) 4 MG disintegrating tablet Take 1 tablet (4 mg total) by mouth every 8 (eight) hours as needed for nausea or vomiting. 06/06/16   Loney Hering, MD  predniSONE (DELTASONE) 50 MG tablet One tablet a day 12/26/18   Caryl Ada K, PA-C  sucralfate (CARAFATE) 1 g tablet Take 1 tablet (1 g total) by mouth 2 (two) times daily. 06/06/16   Loney Hering, MD  ziprasidone (GEODON) 80 MG capsule Take 80 mg by mouth at bedtime.    [provider]    Family History History reviewed. No pertinent family history.  Social History Social History   Tobacco Use  . Smoking status: Passive Smoke Exposure - Never Smoker  . Smokeless tobacco: Never Used  Substance Use Topics  . Alcohol use: No  . Drug use: Yes    Types: Marijuana    Comment: medical marijuana     Allergies   Penicillins   Review of Systems Review of Systems   Physical Exam Updated Vital Signs BP (!) 146/110 (BP Location: Right Arm)   Pulse (!) 102   Temp 98.9 F (37.2 C) (Oral)   Resp 18   Ht  6\' 2"  (1.88 m)   Wt (!) 165.6 kg   SpO2 96%   BMI 46.86 kg/m   Physical Exam Vitals signs and nursing note reviewed.  Constitutional:      Appearance: He is well-developed.  HENT:     Head: Normocephalic and atraumatic.  Eyes:     Conjunctiva/sclera: Conjunctivae normal.  Neck:     Musculoskeletal: Neck supple.  Cardiovascular:     Rate and Rhythm: Normal rate and regular rhythm.     Heart sounds: No murmur.  Pulmonary:     Effort: Pulmonary effort is normal. No respiratory distress.     Breath sounds: Normal breath sounds.  Abdominal:     Palpations: Abdomen is soft.     Tenderness: There is no abdominal tenderness.  Musculoskeletal:        General: Swelling and tenderness present.     Comments: Swollen left 1st  toe,  Pain to touch nv and ns intact  Skin:    General: Skin is warm and dry.  Neurological:     Mental Status: He is alert.  Psychiatric:        Mood and Affect: Mood normal.      ED Treatments / Results  Labs (all labs ordered are listed, but only abnormal results are displayed) Labs Reviewed - No data to display  EKG None  Radiology No results found.  Procedures Procedures (including critical care time)  Medications Ordered in ED Medications  predniSONE (DELTASONE) tablet 60 mg (has no administration in time range)  HYDROcodone-acetaminophen (NORCO/VICODIN) 5-325 MG per tablet 1 tablet (has no administration in time range)  ibuprofen (ADVIL) tablet 800 mg (has no administration in time range)     Initial Impression / Assessment and Plan / ED Course  I have reviewed the triage vital signs and the nursing notes.  Pertinent labs & imaging results that were available during my care of the patient were reviewed by me and considered in my medical decision making (see chart for details).         MDM   Pt counseled on gout and diet Final Clinical Impressions(s) / ED Diagnoses   Final diagnoses:  Gouty arthritis of left great toe    ED Discharge Orders         Ordered    HYDROcodone-acetaminophen (NORCO/VICODIN) 5-325 MG tablet  Every 4 hours PRN     12/26/18 2110    predniSONE (DELTASONE) 50 MG tablet     12/26/18 2110           Osie CheeksSofia,  K, PA-C 12/26/18 2113    Donnetta Hutchingook, Brian, MD 12/27/18 [phone]

## 2018-12-26 NOTE — Discharge Instructions (Signed)
Return if any problems.

## 2018-12-26 NOTE — ED Triage Notes (Signed)
Left big toe pain 3x days, self diagnosed as gout. Warm and swollen.

## 2019-02-14 ENCOUNTER — Encounter (HOSPITAL_COMMUNITY): Payer: Self-pay | Admitting: *Deleted

## 2019-02-14 ENCOUNTER — Other Ambulatory Visit: Payer: Self-pay

## 2019-02-14 ENCOUNTER — Emergency Department (HOSPITAL_COMMUNITY): Payer: 59

## 2019-02-14 ENCOUNTER — Emergency Department (HOSPITAL_COMMUNITY)
Admission: EM | Admit: 2019-02-14 | Discharge: 2019-02-14 | Disposition: A | Discharge status: Home / Self Care | Payer: 59 | Attending: Emergency Medicine | Admitting: Emergency Medicine

## 2019-02-14 DIAGNOSIS — M79675 Pain in left toe(s): Secondary | ICD-10-CM | POA: Diagnosis present

## 2019-02-14 DIAGNOSIS — J45909 Unspecified asthma, uncomplicated: Secondary | ICD-10-CM | POA: Insufficient documentation

## 2019-02-14 DIAGNOSIS — Z79899 Other long term (current) drug therapy: Secondary | ICD-10-CM | POA: Insufficient documentation

## 2019-02-14 DIAGNOSIS — Z7722 Contact with and (suspected) exposure to environmental tobacco smoke (acute) (chronic): Secondary | ICD-10-CM | POA: Insufficient documentation

## 2019-02-14 DIAGNOSIS — M109 Gout, unspecified: Secondary | ICD-10-CM | POA: Diagnosis not present

## 2019-02-14 MED ORDER — PREDNISONE 50 MG PO TABS: ORAL_TABLET | ORAL | 0 refills | Status: DC

## 2019-02-14 MED ORDER — HYDROCODONE-ACETAMINOPHEN 5-325 MG PO TABS
1.0000 | ORAL_TABLET | Freq: Four times a day (QID) | ORAL | 0 refills | Status: DC | PRN
Start: 2019-02-14 — End: 2020-07-04

## 2019-02-14 MED ORDER — COLCHICINE 0.6 MG PO TABS: 0.6000 mg | ORAL_TABLET | Freq: Every day | ORAL | 0 refills | Status: DC

## 2019-02-14 MED ORDER — COLCHICINE 0.6 MG PO TABS
1.2000 mg | ORAL_TABLET | Freq: Once | ORAL | Status: AC
  Administered 2019-02-14: 07:00:00 1.2 mg via ORAL
  Filled 2019-02-14: qty 2

## 2019-02-14 NOTE — ED Provider Notes (Signed)
[facility] EMERGENCY DEPARTMENT Provider Note   CSN: [id number] Arrival date & time: 02/14/19  0608     History   Chief Complaint Chief Complaint  Patient presents with  . Gout    HPI Martin Bruce is a 43 y.o. male.     Patient with ongoing left great toe pain since "the beginning of July".  States this is been a constant pain that was attributed to gout but he has not had a formal diagnosis.  He was seen in the ED July 20 and diagnosed with gout and was given a course of steroids and hydrocodone which he said improved his pain for couple days but the pain soon returned.  The pain is constant and throbbing.  Denies any direct trauma.  States he has been diagnosed with gout in the past but it has never been this bad.  No other joint pain.  No history of diabetes or kidney disease.  States he moved from [place] 1 year ago and still does not have a doctor in this area.   The history is provided by the patient.    Past Medical History:  Diagnosis Date  . Anxiety   . Asthma   . Gout     There are no active problems to display for this patient.   Past Surgical History:  Procedure Laterality Date  . CHOLECYSTECTOMY          Home Medications    Prior to Admission medications   Medication Sig Start Date End Date Taking? Authorizing Provider  albuterol (PROVENTIL HFA;VENTOLIN HFA) 108 (90 BASE) MCG/ACT inhaler Inhale 1 puff into the lungs every 6 (six) hours as needed for wheezing or shortness of breath.    [provider]  albuterol (PROVENTIL) (2.5 MG/3ML) 0.083% nebulizer solution Take 2.5 mg by nebulization every 6 (six) hours as needed for wheezing or shortness of breath.    [provider]  alprazolam Duanne Moron) 2 MG tablet Take 2 mg by mouth 3 (three) times daily.    [provider]  colchicine 0.6 MG tablet Take 0.6 mg by mouth daily.    [provider]  colchicine 0.6 MG tablet Take 1 tablet (0.6 mg total) by mouth daily. 06/05/13    Hazel Sams, PA-C  diazepam (VALIUM) 10 MG tablet Take 20 mg by mouth every 6 (six) hours as needed for anxiety.    [provider]  doxycycline (VIBRAMYCIN) 100 MG capsule Take 1 capsule (100 mg total) by mouth 2 (two) times daily. 08/26/17   Rolland Porter, MD  FLUoxetine (PROZAC) 20 MG capsule Take 20 mg by mouth daily.    [provider]  HYDROcodone-acetaminophen (NORCO/VICODIN) 5-325 MG tablet Take 1 tablet by mouth every 4 (four) hours as needed. 12/26/18   Fransico Meadow, PA-C  ondansetron (ZOFRAN ODT) 4 MG disintegrating tablet Take 1 tablet (4 mg total) by mouth every 8 (eight) hours as needed for nausea or vomiting. 06/06/16   Loney Hering, MD  predniSONE (DELTASONE) 50 MG tablet One tablet a day 12/26/18   Caryl Ada K, PA-C  sucralfate (CARAFATE) 1 g tablet Take 1 tablet (1 g total) by mouth 2 (two) times daily. 06/06/16   Loney Hering, MD  ziprasidone (GEODON) 80 MG capsule Take 80 mg by mouth at bedtime.    [provider]    Family History History reviewed. No pertinent family history.  Social History Social History   Tobacco Use  . Smoking status: Passive  Smoke Exposure - Never Smoker  . Smokeless tobacco: Never Used  Substance Use Topics  . Alcohol use: No  . Drug use: Yes    Types: Marijuana    Comment: medical marijuana     Allergies   Penicillins   Review of Systems Review of Systems  Constitutional: Negative for activity change, appetite change and fever.  HENT: Negative for congestion and rhinorrhea.   Eyes: Negative for visual disturbance.  Respiratory: Negative for cough, chest tightness and shortness of breath.   Cardiovascular: Negative for chest pain.  Gastrointestinal: Negative for abdominal pain, nausea and vomiting.  Genitourinary: Negative for dysuria and hematuria.  Musculoskeletal: Positive for back pain and myalgias.  Neurological: Negative for dizziness, weakness and headaches.    all other systems  are negative except as noted in the HPI and PMH.    Physical Exam Updated Vital Signs BP (!) 139/105 (BP Location: Left Arm)   Pulse 69   Temp 98.8 F (37.1 C) (Oral)   Resp 20   Ht 6\' 2"  (1.88 m)   Wt 74.8 kg   SpO2 98%   BMI 21.18 kg/m   Physical Exam Vitals signs and nursing note reviewed.  Constitutional:      General: He is not in acute distress.    Appearance: He is well-developed. He is obese.  HENT:     Head: Normocephalic and atraumatic.     Mouth/Throat:     Pharynx: No oropharyngeal exudate.  Eyes:     Conjunctiva/sclera: Conjunctivae normal.     Pupils: Pupils are equal, round, and reactive to light.  Neck:     Musculoskeletal: Normal range of motion and neck supple.     Comments: No meningismus. Cardiovascular:     Rate and Rhythm: Normal rate and regular rhythm.     Heart sounds: Normal heart sounds. No murmur.  Pulmonary:     Effort: Pulmonary effort is normal. No respiratory distress.     Breath sounds: Normal breath sounds.  Abdominal:     Palpations: Abdomen is soft.     Tenderness: There is no abdominal tenderness. There is no guarding or rebound.  Musculoskeletal: Normal range of motion.        General: Swelling and tenderness present.     Comments: Swelling, erythema, and tenderness to L great toe and 1st MTP joint. Intact DP and PT pulses.  No fluctuance   Skin:    General: Skin is warm.     Capillary Refill: Capillary refill takes less than 2 seconds.  Neurological:     General: No focal deficit present.     Mental Status: He is alert and oriented to person, place, and time. Mental status is at baseline.     Cranial Nerves: No cranial nerve deficit.     Motor: No abnormal muscle tone.     Coordination: Coordination normal.     Comments: No ataxia on finger to nose bilaterally. No pronator drift. 5/5 strength throughout. CN 2-12 intact.Equal grip strength. Sensation intact.   Psychiatric:        Behavior: Behavior normal.      ED  Treatments / Results  Labs (all labs ordered are listed, but only abnormal results are displayed) Labs Reviewed - No data to display  EKG None  Radiology No results found.  Procedures Procedures (including critical care time)  Medications Ordered in ED Medications  colchicine tablet 1.2 mg (1.2 mg Oral Given 02/14/19 0651)     Initial Impression / Assessment  and Plan / ED Course  I have reviewed the triage vital signs and the nursing notes.  Pertinent labs & imaging results that were available during my care of the patient were reviewed by me and considered in my medical decision making (see chart for details).       2 months of left great toe pain without formal diagnosis.  No direct trauma.  Does appear warm, red and painful with movement.  There is clinical suspicion for gout but x-ray to be obtained.  X-ray pending at shift change.  Care transferred to Dr. Estell HarpinZammit. Resources given to establish care with PCP in the area. Final Clinical Impressions(s) / ED Diagnoses   Final diagnoses:  Acute gout involving toe of left foot, unspecified cause    ED Discharge Orders    None       Colston Pyle, Jeannett SeniorStephen, MD 02/14/19 [phone]

## 2019-02-14 NOTE — Discharge Instructions (Addendum)
Follow up with Dr. Caprice Beaver or a family md

## 2019-02-14 NOTE — ED Notes (Signed)
Pt presents and says needs to have prescriptions sent to [facility] on [address] in [place].  Called pharmacy and will have meds transferred except for the hydrocodone.  Dr. Roderic Palau wrote prescription for hydrocodone and gave to pt.  Called [facility] in [place] and cancelled hydrocodone prescription.

## 2019-02-14 NOTE — ED Notes (Addendum)
Patient called stating that his insurance wont cover prescription for colchicine. Advised patient that we do not do prior authorization paper work. He will need to see his PCP for that type of problem.  I advised that patient that he could pay full price for the medication. Patient states he would have to come back in for medication.

## 2019-02-14 NOTE — ED Triage Notes (Signed)
Pt c/o left great toe pain that has gotten increasingly worse over the last 2 days

## 2019-04-20 ENCOUNTER — Encounter (HOSPITAL_COMMUNITY): Payer: Self-pay | Admitting: Emergency Medicine

## 2019-04-20 ENCOUNTER — Other Ambulatory Visit: Payer: Self-pay

## 2019-04-20 ENCOUNTER — Emergency Department (HOSPITAL_COMMUNITY)
Admission: EM | Admit: 2019-04-20 | Discharge: 2019-04-20 | Disposition: A | Discharge status: Home / Self Care | Payer: 59 | Attending: Emergency Medicine | Admitting: Emergency Medicine

## 2019-04-20 DIAGNOSIS — R1110 Vomiting, unspecified: Secondary | ICD-10-CM | POA: Insufficient documentation

## 2019-04-20 DIAGNOSIS — Z5321 Procedure and treatment not carried out due to patient leaving prior to being seen by health care provider: Secondary | ICD-10-CM | POA: Diagnosis not present

## 2019-04-20 LAB — CBC
HCT: 50.9 % (ref 39.0–52.0)
Hemoglobin: 16.6 g/dL (ref 13.0–17.0)
MCH: 30 pg (ref 26.0–34.0)
MCHC: 32.6 g/dL (ref 30.0–36.0)
MCV: 92 fL (ref 80.0–100.0)
Platelets: 288 10*3/uL (ref 150–400)
RBC: 5.53 MIL/uL (ref 4.22–5.81)
RDW: 12.6 % (ref 11.5–15.5)
WBC: 13.7 10*3/uL — ABNORMAL HIGH (ref 4.0–10.5)
nRBC: 0 % (ref 0.0–0.2)

## 2019-04-20 LAB — COMPREHENSIVE METABOLIC PANEL
ALT: 34 U/L (ref 0–44)
AST: 27 U/L (ref 15–41)
Albumin: 4.3 g/dL (ref 3.5–5.0)
Alkaline Phosphatase: 102 U/L (ref 38–126)
Anion gap: 12 (ref 5–15)
BUN: 11 mg/dL (ref 6–20)
CO2: 28 mmol/L (ref 22–32)
Calcium: 9 mg/dL (ref 8.9–10.3)
Chloride: 98 mmol/L (ref 98–111)
Creatinine, Ser: 1.04 mg/dL (ref 0.61–1.24)
GFR calc Af Amer: >60 mL/min (ref >60)
GFR calc non Af Amer: >60 mL/min (ref >60)
Glucose, Bld: 116 mg/dL — ABNORMAL HIGH (ref 70–99)
Potassium: 3.6 mmol/L (ref 3.5–5.1)
Sodium: 138 mmol/L (ref 135–145)
Total Bilirubin: 0.8 mg/dL (ref 0.3–1.2)
Total Protein: 7.6 g/dL (ref 6.5–8.1)

## 2019-04-20 LAB — LIPASE, BLOOD: Lipase: 22 U/L (ref 11–51)

## 2019-04-20 NOTE — ED Triage Notes (Signed)
Pt c/o of vomiting x 2 days

## 2019-04-21 ENCOUNTER — Encounter (HOSPITAL_COMMUNITY): Payer: Self-pay

## 2019-04-21 ENCOUNTER — Emergency Department (HOSPITAL_COMMUNITY)
Admission: EM | Admit: 2019-04-21 | Discharge: 2019-04-21 | Disposition: A | Discharge status: Home / Self Care | Payer: 59 | Attending: Emergency Medicine | Admitting: Emergency Medicine

## 2019-04-21 ENCOUNTER — Other Ambulatory Visit: Payer: Self-pay

## 2019-04-21 DIAGNOSIS — J45909 Unspecified asthma, uncomplicated: Secondary | ICD-10-CM | POA: Insufficient documentation

## 2019-04-21 DIAGNOSIS — K2900 Acute gastritis without bleeding: Secondary | ICD-10-CM

## 2019-04-21 DIAGNOSIS — Z79899 Other long term (current) drug therapy: Secondary | ICD-10-CM | POA: Insufficient documentation

## 2019-04-21 DIAGNOSIS — Z7722 Contact with and (suspected) exposure to environmental tobacco smoke (acute) (chronic): Secondary | ICD-10-CM | POA: Insufficient documentation

## 2019-04-21 DIAGNOSIS — R112 Nausea with vomiting, unspecified: Secondary | ICD-10-CM | POA: Diagnosis present

## 2019-04-21 LAB — COMPREHENSIVE METABOLIC PANEL
ALT: 43 U/L (ref 0–44)
AST: 34 U/L (ref 15–41)
Albumin: 3.7 g/dL (ref 3.5–5.0)
Alkaline Phosphatase: 96 U/L (ref 38–126)
Anion gap: 18 — ABNORMAL HIGH (ref 5–15)
BUN: 7 mg/dL (ref 6–20)
CO2: 25 mmol/L (ref 22–32)
Calcium: 9.3 mg/dL (ref 8.9–10.3)
Chloride: 95 mmol/L — ABNORMAL LOW (ref 98–111)
Creatinine, Ser: 1.05 mg/dL (ref 0.61–1.24)
GFR calc Af Amer: >60 mL/min (ref >60)
GFR calc non Af Amer: >60 mL/min (ref >60)
Glucose, Bld: 114 mg/dL — ABNORMAL HIGH (ref 70–99)
Potassium: 3.4 mmol/L — ABNORMAL LOW (ref 3.5–5.1)
Sodium: 138 mmol/L (ref 135–145)
Total Bilirubin: 1 mg/dL (ref 0.3–1.2)
Total Protein: 7.5 g/dL (ref 6.5–8.1)

## 2019-04-21 LAB — CBC
HCT: 52.8 % — ABNORMAL HIGH (ref 39.0–52.0)
Hemoglobin: 17.6 g/dL — ABNORMAL HIGH (ref 13.0–17.0)
MCH: 30.2 pg (ref 26.0–34.0)
MCHC: 33.3 g/dL (ref 30.0–36.0)
MCV: 90.6 fL (ref 80.0–100.0)
Platelets: 281 10*3/uL (ref 150–400)
RBC: 5.83 MIL/uL — ABNORMAL HIGH (ref 4.22–5.81)
RDW: 12.3 % (ref 11.5–15.5)
WBC: 7.9 10*3/uL (ref 4.0–10.5)
nRBC: 0 % (ref 0.0–0.2)

## 2019-04-21 LAB — LIPASE, BLOOD: Lipase: 24 U/L (ref 11–51)

## 2019-04-21 MED ORDER — SODIUM CHLORIDE 0.9% FLUSH: 3.0000 mL | Freq: Once | INTRAVENOUS | Status: DC

## 2019-04-21 MED ORDER — FAMOTIDINE 20 MG PO TABS: 20.0000 mg | ORAL_TABLET | Freq: Two times a day (BID) | ORAL | 0 refills | Status: DC

## 2019-04-21 MED ORDER — ONDANSETRON HCL 4 MG/2ML IJ SOLN
4.0000 mg | Freq: Once | INTRAMUSCULAR | Status: AC
  Administered 2019-04-21: 4 mg via INTRAVENOUS
  Filled 2019-04-21: qty 2

## 2019-04-21 MED ORDER — SODIUM CHLORIDE 0.9 % IV BOLUS
1000.0000 mL | Freq: Once | INTRAVENOUS | Status: AC
  Administered 2019-04-21: 1000 mL via INTRAVENOUS

## 2019-04-21 MED ORDER — ONDANSETRON 4 MG PO TBDP: 4.0000 mg | ORAL_TABLET | Freq: Three times a day (TID) | ORAL | 0 refills | Status: DC | PRN

## 2019-04-21 MED ORDER — FAMOTIDINE IN NACL 20-0.9 MG/50ML-% IV SOLN
20.0000 mg | Freq: Once | INTRAVENOUS | Status: AC
  Administered 2019-04-21: 20 mg via INTRAVENOUS
  Filled 2019-04-21: qty 50

## 2019-04-21 MED ORDER — ALUM & MAG HYDROXIDE-SIMETH 200-200-20 MG/5ML PO SUSP
30.0000 mL | Freq: Once | ORAL | Status: AC
  Administered 2019-04-21: 30 mL via ORAL
  Filled 2019-04-21: qty 30

## 2019-04-21 NOTE — Discharge Instructions (Signed)
Take the medications as needed for your symptoms. It is important for you to slowly advance her diet as tolerated. Return to the ED if you start to have worsening symptoms, develop a fever, worsening abdominal pain, blood in your stool, chest pain or shortness of breath.

## 2019-04-21 NOTE — ED Triage Notes (Signed)
Patient complains of 5 days of vomiting with abdominal cramping. Denies diarrhea. No hx of GI issues

## 2019-04-21 NOTE — ED Provider Notes (Signed)
[facility] EMERGENCY DEPARTMENT Provider Note   CSN: [id number] Arrival date & time: 04/21/19  1636     History   Chief Complaint No chief complaint on file.   HPI Martin Bruce is a 43 y.o. male with a past medical history of anxiety presenting to the ED with a chief complaint of vomiting.  For the past 5 days been having generalized abdominal cramping and several episodes of nonbloody, nonbilious emesis.  He has not tried any over-the-counter medications to help with his symptoms.  Had diarrhea on the first day of symptom onset but has since resolved.  States that the last full meal that he ate before symptoms began was from [employer].  No sick contacts with similar symptoms.  He denies history of similar symptoms in the past.  States that his abdomen feels "hot all over."  His last episode of vomiting was this morning when he drank milk and ginger ale.  Denies prior abdominal surgeries, alcohol, tobacco or other drug use.  Denies hematemesis, recent travel, shortness of breath, chest pain, urinary symptoms.     HPI  Past Medical History:  Diagnosis Date   Anxiety    Asthma    Gout     There are no active problems to display for this patient.   Past Surgical History:  Procedure Laterality Date   CHOLECYSTECTOMY          Home Medications    Prior to Admission medications   Medication Sig Start Date End Date Taking? Authorizing Provider  albuterol (PROVENTIL HFA;VENTOLIN HFA) 108 (90 BASE) MCG/ACT inhaler Inhale 1 puff into the lungs every 6 (six) hours as needed for wheezing or shortness of breath.    [provider]  albuterol (PROVENTIL) (2.5 MG/3ML) 0.083% nebulizer solution Take 2.5 mg by nebulization every 6 (six) hours as needed for wheezing or shortness of breath.    [provider]  alprazolam Prudy Feeler) 2 MG tablet Take 2 mg by mouth 3 (three) times daily.    [provider]  colchicine 0.6 MG tablet Take 1  tablet (0.6 mg total) by mouth daily. 02/14/19   Bethann Berkshire, MD  diazepam (VALIUM) 10 MG tablet Take 20 mg by mouth every 6 (six) hours as needed for anxiety.    [provider]  doxycycline (VIBRAMYCIN) 100 MG capsule Take 1 capsule (100 mg total) by mouth 2 (two) times daily. 08/26/17   Devoria Albe, MD  famotidine (PEPCID) 20 MG tablet Take 1 tablet (20 mg total) by mouth 2 (two) times daily. 04/21/19   Macy Polio, PA-C  FLUoxetine (PROZAC) 20 MG capsule Take 20 mg by mouth daily.    [provider]  HYDROcodone-acetaminophen (NORCO/VICODIN) 5-325 MG tablet Take 1 tablet by mouth every 6 (six) hours as needed. 02/14/19   Bethann Berkshire, MD  ondansetron (ZOFRAN ODT) 4 MG disintegrating tablet Take 1 tablet (4 mg total) by mouth every 8 (eight) hours as needed for nausea or vomiting. 04/21/19   Sundeep Destin, Hillary Bow, PA-C  predniSONE (DELTASONE) 50 MG tablet 1 tablet PO daily 02/14/19   Bethann Berkshire, MD  sucralfate (CARAFATE) 1 g tablet Take 1 tablet (1 g total) by mouth 2 (two) times daily. 06/06/16   Rebecka Apley, MD  ziprasidone (GEODON) 80 MG capsule Take 80 mg by mouth at bedtime.    [provider]    Family History No family history on file.  Social History Social History   Tobacco Use   Smoking  status: Passive Smoke Exposure - Never Smoker   Smokeless tobacco: Never Used  Substance Use Topics   Alcohol use: No   Drug use: Yes    Types: Marijuana    Comment: medical marijuana     Allergies   Penicillins   Review of Systems Review of Systems  Constitutional: Negative for appetite change, chills and fever.  HENT: Negative for ear pain, rhinorrhea, sneezing and sore throat.   Eyes: Negative for photophobia and visual disturbance.  Respiratory: Negative for cough, chest tightness, shortness of breath and wheezing.   Cardiovascular: Negative for chest pain and palpitations.  Gastrointestinal: Positive for abdominal pain, nausea and vomiting.  Negative for blood in stool, constipation and diarrhea.  Genitourinary: Negative for dysuria, hematuria and urgency.  Musculoskeletal: Negative for myalgias.  Skin: Negative for rash.  Neurological: Negative for dizziness, weakness and light-headedness.     Physical Exam Updated Vital Signs BP (!) 100/55 (BP Location: Left Arm)    Pulse 70    Temp 98.2 F (36.8 C) (Oral)    Resp 18    SpO2 95%   Physical Exam Vitals signs and nursing note reviewed.  Constitutional:      General: He is not in acute distress.    Appearance: He is well-developed.  HENT:     Head: Normocephalic and atraumatic.     Nose: Nose normal.  Eyes:     General: No scleral icterus.       Right eye: No discharge.        Left eye: No discharge.     Conjunctiva/sclera: Conjunctivae normal.  Neck:     Musculoskeletal: Normal range of motion and neck supple.  Cardiovascular:     Rate and Rhythm: Normal rate and regular rhythm.     Heart sounds: Normal heart sounds. No murmur. No friction rub. No gallop.   Pulmonary:     Effort: Pulmonary effort is normal. No respiratory distress.     Breath sounds: Normal breath sounds.  Abdominal:     General: Bowel sounds are normal. There is no distension.     Palpations: Abdomen is soft.     Tenderness: There is no abdominal tenderness. There is no guarding.  Musculoskeletal: Normal range of motion.  Skin:    General: Skin is warm and dry.     Findings: No rash.  Neurological:     Mental Status: He is alert.     Motor: No abnormal muscle tone.     Coordination: Coordination normal.      ED Treatments / Results  Labs (all labs ordered are listed, but only abnormal results are displayed) Labs Reviewed  COMPREHENSIVE METABOLIC PANEL - Abnormal; Notable for the following components:      Result Value   Potassium 3.4 (*)    Chloride 95 (*)    Glucose, Bld 114 (*)    Anion gap 18 (*)    All other components within normal limits  CBC - Abnormal; Notable for the  following components:   RBC 5.83 (*)    Hemoglobin 17.6 (*)    HCT 52.8 (*)    All other components within normal limits  LIPASE, BLOOD    EKG None  Radiology No results found.  Procedures Procedures (including critical care time)  Medications Ordered in ED Medications  sodium chloride flush (NS) 0.9 % injection 3 mL (0 mLs Intravenous Hold 04/21/19 1815)  famotidine (PEPCID) IVPB 20 mg premix (0 mg Intravenous Stopped 04/21/19 2001)  ondansetron (ZOFRAN)  injection 4 mg (4 mg Intravenous Given 04/21/19 1842)  sodium chloride 0.9 % bolus 1,000 mL (0 mLs Intravenous Stopped 04/21/19 2127)  alum & mag hydroxide-simeth (MAALOX/MYLANTA) 200-200-20 MG/5ML suspension 30 mL (30 mLs Oral Given 04/21/19 2001)     Initial Impression / Assessment and Plan / ED Course  I have reviewed the triage vital signs and the nursing notes.  Pertinent labs & imaging results that were available during my care of the patient were reviewed by me and considered in my medical decision making (see chart for details).        43 year old male with a past medical history of anxiety presenting to the ED with a chief complaint of vomiting.  Symptoms have been going on for the past 5 days with associated abdominal cramping.  Did eat a meal at [employer] prior to symptom onset.  On exam abdomen is generally tender without rebound or guarding.  Otherwise he is overall well-appearing.  Vital signs are within normal limits.  Lab work including CMP, CBC, lipase unremarkable.  Patient was given Zofran, GI cocktail, Pepcid and IV fluids with significant improvement in his symptoms.  Repeat abdominal exams are benign.  Suspect that his symptoms are viral in nature.  Doubt appendicitis, cholecystitis or other surgical emergent cause of symptoms.  He is able to tolerate p.o. intake without difficulty.  We will have him follow-up with PCP, give symptomatic treatment and return for worsening symptoms.  Patient is  hemodynamically stable, in NAD, and able to ambulate in the ED. Evaluation does not show pathology that would require ongoing emergent intervention or inpatient treatment. I explained the diagnosis to the patient. Pain has been managed and has no complaints prior to discharge. Patient is comfortable with above plan and is stable for discharge at this time. All questions were answered prior to disposition. Strict return precautions for returning to the ED were discussed. Encouraged follow up with PCP.   An After Visit Summary was printed and given to the patient.   Portions of this note were generated with Scientist, clinical (histocompatibility and immunogenetics)Dragon dictation software. Dictation errors may occur despite best attempts at proofreading.   Final Clinical Impressions(s) / ED Diagnoses   Final diagnoses:  Acute gastritis without hemorrhage, unspecified gastritis type  Non-intractable vomiting with nausea, unspecified vomiting type    ED Discharge Orders         Ordered    ondansetron (ZOFRAN ODT) 4 MG disintegrating tablet  Every 8 hours PRN     04/21/19 2154    famotidine (PEPCID) 20 MG tablet  2 times daily     04/21/19 2154           Dietrich PatesKhatri, Brandon Scarbrough, PA-C 04/21/19 2157    Alvira MondaySchlossman, Erin, MD 04/23/19 2212

## 2019-04-21 NOTE — ED Notes (Signed)
Patient verbalizes understanding of discharge instructions. Opportunity for questioning and answers were provided. Armband removed by staff, pt discharged from ED.  

## 2019-06-07 ENCOUNTER — Other Ambulatory Visit: Payer: Self-pay

## 2019-06-07 ENCOUNTER — Emergency Department (HOSPITAL_COMMUNITY)
Admission: EM | Admit: 2019-06-07 | Discharge: 2019-06-07 | Disposition: A | Discharge status: Home / Self Care | Payer: 59 | Attending: Emergency Medicine | Admitting: Emergency Medicine

## 2019-06-07 ENCOUNTER — Emergency Department (HOSPITAL_COMMUNITY): Payer: 59

## 2019-06-07 ENCOUNTER — Encounter (HOSPITAL_COMMUNITY): Payer: Self-pay

## 2019-06-07 DIAGNOSIS — Z7722 Contact with and (suspected) exposure to environmental tobacco smoke (acute) (chronic): Secondary | ICD-10-CM | POA: Diagnosis not present

## 2019-06-07 DIAGNOSIS — Z79899 Other long term (current) drug therapy: Secondary | ICD-10-CM | POA: Insufficient documentation

## 2019-06-07 DIAGNOSIS — N50811 Right testicular pain: Secondary | ICD-10-CM | POA: Diagnosis not present

## 2019-06-07 DIAGNOSIS — R109 Unspecified abdominal pain: Secondary | ICD-10-CM | POA: Insufficient documentation

## 2019-06-07 DIAGNOSIS — R197 Diarrhea, unspecified: Secondary | ICD-10-CM | POA: Diagnosis not present

## 2019-06-07 DIAGNOSIS — Z20828 Contact with and (suspected) exposure to other viral communicable diseases: Secondary | ICD-10-CM | POA: Insufficient documentation

## 2019-06-07 DIAGNOSIS — J45909 Unspecified asthma, uncomplicated: Secondary | ICD-10-CM | POA: Diagnosis not present

## 2019-06-07 DIAGNOSIS — R112 Nausea with vomiting, unspecified: Secondary | ICD-10-CM | POA: Insufficient documentation

## 2019-06-07 LAB — URINALYSIS, ROUTINE W REFLEX MICROSCOPIC
Bilirubin Urine: NEGATIVE
Glucose, UA: NEGATIVE mg/dL
Hgb urine dipstick: NEGATIVE
Ketones, ur: 5 mg/dL — AB
Nitrite: NEGATIVE
Protein, ur: NEGATIVE mg/dL
Specific Gravity, Urine: >1.046 — ABNORMAL HIGH (ref 1.005–1.030)
pH: 5 (ref 5.0–8.0)

## 2019-06-07 LAB — COMPREHENSIVE METABOLIC PANEL
ALT: 27 U/L (ref 0–44)
AST: 18 U/L (ref 15–41)
Albumin: 3.9 g/dL (ref 3.5–5.0)
Alkaline Phosphatase: 102 U/L (ref 38–126)
Anion gap: 16 — ABNORMAL HIGH (ref 5–15)
BUN: 13 mg/dL (ref 6–20)
CO2: 25 mmol/L (ref 22–32)
Calcium: 8.9 mg/dL (ref 8.9–10.3)
Chloride: 98 mmol/L (ref 98–111)
Creatinine, Ser: 1 mg/dL (ref 0.61–1.24)
GFR calc Af Amer: >60 mL/min (ref >60)
GFR calc non Af Amer: >60 mL/min (ref >60)
Glucose, Bld: 134 mg/dL — ABNORMAL HIGH (ref 70–99)
Potassium: 3.3 mmol/L — ABNORMAL LOW (ref 3.5–5.1)
Sodium: 139 mmol/L (ref 135–145)
Total Bilirubin: 0.7 mg/dL (ref 0.3–1.2)
Total Protein: 7.6 g/dL (ref 6.5–8.1)

## 2019-06-07 LAB — CBC WITH DIFFERENTIAL/PLATELET
Abs Immature Granulocytes: 0.05 10*3/uL (ref 0.00–0.07)
Basophils Absolute: 0 10*3/uL (ref 0.0–0.1)
Basophils Relative: 0 %
Eosinophils Absolute: 0 10*3/uL (ref 0.0–0.5)
Eosinophils Relative: 0 %
HCT: 48.2 % (ref 39.0–52.0)
Hemoglobin: 16.6 g/dL (ref 13.0–17.0)
Immature Granulocytes: 0 %
Lymphocytes Relative: 14 %
Lymphs Abs: 2.1 10*3/uL (ref 0.7–4.0)
MCH: 31.1 pg (ref 26.0–34.0)
MCHC: 34.4 g/dL (ref 30.0–36.0)
MCV: 90.3 fL (ref 80.0–100.0)
Monocytes Absolute: 0.9 10*3/uL (ref 0.1–1.0)
Monocytes Relative: 6 %
Neutro Abs: 11.6 10*3/uL — ABNORMAL HIGH (ref 1.7–7.7)
Neutrophils Relative %: 80 %
Platelets: 276 10*3/uL (ref 150–400)
RBC: 5.34 MIL/uL (ref 4.22–5.81)
RDW: 12.7 % (ref 11.5–15.5)
WBC: 14.6 10*3/uL — ABNORMAL HIGH (ref 4.0–10.5)
nRBC: 0 % (ref 0.0–0.2)

## 2019-06-07 LAB — LIPASE, BLOOD: Lipase: 20 U/L (ref 11–51)

## 2019-06-07 LAB — POC SARS CORONAVIRUS 2 AG -  ED: SARS Coronavirus 2 Ag: NEGATIVE

## 2019-06-07 MED ORDER — SODIUM CHLORIDE 0.9 % IV SOLN: Freq: Once | INTRAVENOUS | Status: AC

## 2019-06-07 MED ORDER — PANTOPRAZOLE SODIUM 40 MG IV SOLR
40.0000 mg | Freq: Once | INTRAVENOUS | Status: AC
  Administered 2019-06-07: 40 mg via INTRAVENOUS
  Filled 2019-06-07: qty 40

## 2019-06-07 MED ORDER — ONDANSETRON HCL 4 MG/2ML IJ SOLN
4.0000 mg | Freq: Once | INTRAMUSCULAR | Status: AC
  Administered 2019-06-07: 4 mg via INTRAVENOUS

## 2019-06-07 MED ORDER — ONDANSETRON HCL 4 MG PO TABS: 4.0000 mg | ORAL_TABLET | Freq: Four times a day (QID) | ORAL | 0 refills | Status: DC

## 2019-06-07 MED ORDER — ONDANSETRON HCL 4 MG/2ML IJ SOLN
INTRAMUSCULAR | Status: AC
  Filled 2019-06-07: qty 2

## 2019-06-07 MED ORDER — PANTOPRAZOLE SODIUM 20 MG PO TBEC: 20.0000 mg | DELAYED_RELEASE_TABLET | Freq: Every day | ORAL | 0 refills | Status: DC

## 2019-06-07 MED ORDER — IOHEXOL 300 MG/ML  SOLN
100.0000 mL | Freq: Once | INTRAMUSCULAR | Status: AC | PRN
  Administered 2019-06-07: 100 mL via INTRAVENOUS

## 2019-06-07 NOTE — ED Notes (Signed)
PA in with pt 

## 2019-06-07 NOTE — ED Provider Notes (Signed)
[facility] EMERGENCY DEPARTMENT Provider Note   CSN: [id number] Arrival date & time: 06/07/19  6283     History Chief Complaint  Patient presents with  . Abdominal Pain  . Emesis    Martin Bruce is a 43 y.o. male.  HPI      Martin Bruce is a 43 y.o. male with past medical history of anxiety who presents to the Emergency Department complaining of nausea, vomiting, and occasional diarrhea.  Symptoms began yesterday.  He reports drinking alcohol on Monday evening and woke with vomiting yesterday morning.  He has had several episodes of loose stools.  He states his stools have been brown in color without blood or mucus.  He noticed several streaks of bright red blood in his vomitus this morning.  He has tolerated a few sips of soda yesterday.  He also reports having pain to his right testicle that began after excessive vomiting.  He denies dysuria, burning with urination, flank pain or back pain, penile discharge or swelling of his testicles.  No known recent Covid exposures.  He denies fever, chest pain, shortness of breath.    Past Medical History:  Diagnosis Date  . Anxiety   . Asthma   . Gout     There are no problems to display for this patient.   Past Surgical History:  Procedure Laterality Date  . CHOLECYSTECTOMY     denies     No family history on file.  Social History   Tobacco Use  . Smoking status: Passive Smoke Exposure - Never Smoker  . Smokeless tobacco: Never Used  Substance Use Topics  . Alcohol use: Yes    Comment: occ  . Drug use: Yes    Types: Marijuana    Comment: medical marijuana    Home Medications Prior to Admission medications   Medication Sig Start Date End Date Taking? Authorizing Provider  albuterol (PROVENTIL HFA;VENTOLIN HFA) 108 (90 BASE) MCG/ACT inhaler Inhale 1 puff into the lungs every 6 (six) hours as needed for wheezing or shortness of breath.    [provider]  albuterol (PROVENTIL) (2.5 MG/3ML) 0.083%  nebulizer solution Take 2.5 mg by nebulization every 6 (six) hours as needed for wheezing or shortness of breath.    [provider]  alprazolam Prudy Feeler) 2 MG tablet Take 2 mg by mouth 3 (three) times daily.    [provider]  colchicine 0.6 MG tablet Take 1 tablet (0.6 mg total) by mouth daily. 02/14/19   Martin Berkshire, MD  diazepam (VALIUM) 10 MG tablet Take 20 mg by mouth every 6 (six) hours as needed for anxiety.    [provider]  doxycycline (VIBRAMYCIN) 100 MG capsule Take 1 capsule (100 mg total) by mouth 2 (two) times daily. 08/26/17   Martin Albe, MD  famotidine (PEPCID) 20 MG tablet Take 1 tablet (20 mg total) by mouth 2 (two) times daily. 04/21/19   Khatri, Hina, PA-C  FLUoxetine (PROZAC) 20 MG capsule Take 20 mg by mouth daily.    [provider]  HYDROcodone-acetaminophen (NORCO/VICODIN) 5-325 MG tablet Take 1 tablet by mouth every 6 (six) hours as needed. 02/14/19   Martin Berkshire, MD  ondansetron (ZOFRAN ODT) 4 MG disintegrating tablet Take 1 tablet (4 mg total) by mouth every 8 (eight) hours as needed for nausea or vomiting. 04/21/19   Dietrich Pates, PA-C  predniSONE (DELTASONE) 50 MG tablet 1 tablet PO daily 02/14/19   Martin Berkshire, MD  sucralfate (CARAFATE) 1 g  tablet Take 1 tablet (1 g total) by mouth 2 (two) times daily. 06/06/16   Martin Apley, MD  ziprasidone (GEODON) 80 MG capsule Take 80 mg by mouth at bedtime.    [provider]    Allergies    Penicillins  Review of Systems   Review of Systems  Constitutional: Negative for appetite change, chills and fever.  Respiratory: Negative for shortness of breath.   Cardiovascular: Negative for chest pain.  Gastrointestinal: Positive for abdominal pain, diarrhea, nausea and vomiting. Negative for blood in stool.  Genitourinary: Positive for testicular pain. Negative for decreased urine volume, difficulty urinating, discharge, dysuria, flank pain, hematuria, penile swelling and  scrotal swelling.  Musculoskeletal: Negative for back pain.  Skin: Negative for color change and rash.  Neurological: Negative for dizziness, weakness and numbness.  Hematological: Negative for adenopathy.    Physical Exam Updated Vital Signs BP (!) 164/94 (BP Location: Right Arm)   Pulse (!) 56   Temp 98.9 F (37.2 C) (Oral)   Resp 18   Ht  (1.88 m)   Wt 90.7 kg   SpO2 99%   BMI 25.68 kg/m   Physical Exam Vitals and nursing note reviewed. Exam conducted with a chaperone present.  Constitutional:      General: He is not in acute distress.    Appearance: He is well-developed. He is obese. He is not ill-appearing.  HENT:     Head: Normocephalic and atraumatic.  Cardiovascular:     Rate and Rhythm: Normal rate and regular rhythm.     Heart sounds: Normal heart sounds. No murmur.  Pulmonary:     Effort: Pulmonary effort is normal. No respiratory distress.     Breath sounds: Normal breath sounds.  Abdominal:     General: There is no distension.     Palpations: Abdomen is soft. There is no mass.     Tenderness: There is generalized abdominal tenderness. There is no guarding or rebound.  Genitourinary:    Penis: Normal. No phimosis or paraphimosis.      Testes:        Right: Tenderness present. Mass or swelling not present. Cremasteric reflex is present.      Epididymis:     Right: Normal.     Comments: No palpable hernia's Musculoskeletal:        General: Normal range of motion.  Lymphadenopathy:     Lower Body: No right inguinal adenopathy.  Skin:    General: Skin is warm and dry.  Neurological:     Mental Status: He is alert and oriented to person, place, and time.     Motor: No abnormal muscle tone.     Coordination: Coordination normal.     ED Results / Procedures / Treatments   Labs (all labs ordered are listed, but only abnormal results are displayed) Labs Reviewed  CBC WITH DIFFERENTIAL/PLATELET - Abnormal; Notable for the following components:       Result Value   WBC 14.6 (*)    Neutro Abs 11.6 (*)    All other components within normal limits  COMPREHENSIVE METABOLIC PANEL - Abnormal; Notable for the following components:   Potassium 3.3 (*)    Glucose, Bld 134 (*)    Anion gap 16 (*)    All other components within normal limits  URINALYSIS, ROUTINE W REFLEX MICROSCOPIC - Abnormal; Notable for the following components:   Specific Gravity, Urine >1.046 (*)    Ketones, ur 5 (*)    Leukocytes,Ua  TRACE (*)    Bacteria, UA RARE (*)    All other components within normal limits  URINE CULTURE  LIPASE, BLOOD  POC SARS CORONAVIRUS 2 AG -  ED    EKG None  Radiology CT ABDOMEN PELVIS W CONTRAST  Result Date: 06/07/2019 CLINICAL DATA:  Nausea and vomiting. EXAM: CT ABDOMEN AND PELVIS WITH CONTRAST TECHNIQUE: Multidetector CT imaging of the abdomen and pelvis was performed using the standard protocol following bolus administration of intravenous contrast. CONTRAST:  149mL OMNIPAQUE IOHEXOL 300 MG/ML  SOLN COMPARISON:  None. FINDINGS: Lower chest: The lung bases are clear of acute process. No pleural effusion or pulmonary lesions. The heart is normal in size. No pericardial effusion. The distal esophagus and aorta are unremarkable. Hepatobiliary: No focal hepatic lesions or intrahepatic biliary dilatation. The gallbladder is normal. No common bile duct dilatation. Pancreas: No mass, inflammation or ductal dilatation. Spleen: Normal size.  No focal lesions. Adrenals/Urinary Tract: The adrenal glands, kidneys and bladder are unremarkable. Stomach/Bowel: The stomach, duodenum, small bowel and colon are grossly normal without oral contrast. No acute inflammatory changes, mass lesions or obstructive findings. The terminal ileum is normal. The appendix is normal. Vascular/Lymphatic: The aorta is normal in caliber. No dissection. The branch vessels are patent. The major venous structures are patent. No mesenteric or retroperitoneal mass or adenopathy.  Small scattered lymph nodes are noted. Reproductive: The prostate gland and seminal vesicles are unremarkable. Other: No pelvic mass or adenopathy. No free pelvic fluid collections. No inguinal mass or adenopathy. No abdominal wall hernia or subcutaneous lesions. Musculoskeletal: No significant bony findings. IMPRESSION: Unremarkable abdominal/pelvic CT scan. No acute abdominal/pelvic findings, mass lesions or adenopathy. 1. Electronically Signed   By: Marijo Sanes M.D.   On: 06/07/2019 10:14   US SCROTUM W/DOPPLER  Result Date: 06/07/2019 CLINICAL DATA:  Right testicular pain and swelling for 1 day EXAM: SCROTAL ULTRASOUND DOPPLER ULTRASOUND OF THE TESTICLES TECHNIQUE: Complete ultrasound examination of the testicles, epididymis, and other scrotal structures was performed. Color and spectral Doppler ultrasound were also utilized to evaluate blood flow to the testicles. COMPARISON:  None. FINDINGS: Right testicle Measurements: 3.0 x 1.4 x 2.1 cm. No mass or microlithiasis visualized. Left testicle Measurements: 2.7 x 1.2 x 2.1 cm. No mass or microlithiasis visualized. Right epididymis:  Normal in size and appearance. Left epididymis:  Normal in size and appearance. Hydrocele:  None visualized. Varicocele:  Mild bilateral varicoceles are seen. Pulsed Doppler interrogation of both testes demonstrates normal low resistance arterial and venous waveforms bilaterally. IMPRESSION: Mild bilateral varicoceles.  No testicular abnormality is noted. Electronically Signed   By: Inez Catalina M.D.   On: 06/07/2019 10:03    Procedures Procedures (including critical care time)  Medications Ordered in ED Medications  ondansetron (ZOFRAN) 4 MG/2ML injection (has no administration in time range)  ondansetron (ZOFRAN) injection 4 mg (4 mg Intravenous Given 06/07/19 0846)  0.9 %  sodium chloride infusion ( Intravenous New Bag/Given 06/07/19 3875)    ED Course  I have reviewed the triage vital signs and the nursing  notes.  Pertinent labs & imaging results that were available during my care of the patient were reviewed by me and considered in my medical decision making (see chart for details).    MDM Rules/Calculators/A&P                      Pt with 24 hours of vomiting and diarrhea.  Generalized abdominal pain w/o peritoneal signs.  He also  has right testicular tenderness that developed after vomiting.  No edema of the scrotum.  Denies penile discharge or dysuria.  Will obtain CT abdomen and pelvis and ultrasound of the scrotum with Doppler.  No fever.  Clinically, low suspicion for COVID-19.  Will also obtain Covid swab.  Labs, CT abdomen and ultrasound all reassuring.  Patient has received IV fluids, antibiotics and PPI.  He reports feeling much better, vomiting has resolved.  He is also tolerating oral fluids.  He is non-toxic appearing.  He agrees to close outpatient follow-up.  Strict return precautions were discussed.   Final Clinical Impression(s) / ED Diagnoses Final diagnoses:  Testicular pain, right  Nausea vomiting and diarrhea    Rx / DC Orders ED Discharge Orders    None       Pauline Ausriplett, Margurete Guaman, PA-C 06/07/19 1612    Vanetta MuldersZackowski, Scott, MD 06/09/19 [phone]

## 2019-06-07 NOTE — Discharge Instructions (Addendum)
Frequent, small sips of clear fluids this afternoon.  Bland diet as tolerated starting tonight or tomorrow morning.  Follow-up with your primary doctor for recheck, return to the ER if you develop any worsening symptoms such as increasing abdominal pain or fever.

## 2019-06-07 NOTE — ED Notes (Signed)
Patient transported to CT 

## 2019-06-07 NOTE — ED Notes (Signed)
Waiting for urine sample.  Apple juice given as requested.

## 2019-06-07 NOTE — ED Notes (Signed)
Patient asking for apple juice.  Patient told we need to get his x-ray complete before he can have anything.  States his stomach is burning.

## 2019-06-07 NOTE — ED Triage Notes (Signed)
Pt reports he drank a lot of alcohol Monday and has been vomiting since yesterday.  Denies diarrhea.  LBM was yesterday.   Pt also reports pain in testicles.  Pt says cough started yesterday when he was vomiting.

## 2019-06-07 NOTE — ED Notes (Signed)
U/S tech in to do ultra sound

## 2019-06-08 LAB — URINE CULTURE: Culture: NO GROWTH

## 2020-01-15 ENCOUNTER — Other Ambulatory Visit: Payer: Self-pay

## 2020-01-15 ENCOUNTER — Emergency Department (HOSPITAL_COMMUNITY)
Admission: EM | Admit: 2020-01-15 | Discharge: 2020-01-15 | Disposition: A | Discharge status: Home / Self Care | Payer: 59 | Attending: Emergency Medicine | Admitting: Emergency Medicine

## 2020-01-15 ENCOUNTER — Encounter (HOSPITAL_COMMUNITY): Payer: Self-pay | Admitting: *Deleted

## 2020-01-15 DIAGNOSIS — J45909 Unspecified asthma, uncomplicated: Secondary | ICD-10-CM | POA: Insufficient documentation

## 2020-01-15 DIAGNOSIS — Z7722 Contact with and (suspected) exposure to environmental tobacco smoke (acute) (chronic): Secondary | ICD-10-CM | POA: Insufficient documentation

## 2020-01-15 DIAGNOSIS — Z0000 Encounter for general adult medical examination without abnormal findings: Secondary | ICD-10-CM | POA: Diagnosis present

## 2020-01-15 DIAGNOSIS — Z139 Encounter for screening, unspecified: Secondary | ICD-10-CM

## 2020-01-15 NOTE — ED Provider Notes (Signed)
Emergency Department Provider Note   I have reviewed the triage vital signs and the nursing notes.   HISTORY  Chief Complaint insect in food   HPI Martin Bruce is a 44 y.o. male with past medical history reviewed below presents to the emergency department for evaluation after finding a roach in his takeout food bag.  Patient was eating breakfast and when he was done he noticed there was a live roach in his bag.  He killed the [place] to get back into the restaurant.  He is not having symptoms but is very concerned that he may have eaten another roach.  He is not experiencing nausea or vomiting.  No abdominal pain.  No chest pain or shortness of breath.  Patient is very concerned and is asking for something to make him vomit or if we could pump his stomach.    Past Medical History:  Diagnosis Date  . Anxiety   . Asthma   . Gout     There are no problems to display for this patient.   Past Surgical History:  Procedure Laterality Date  . CARPAL TUNNEL RELEASE    . CHOLECYSTECTOMY     denies    Allergies Penicillins  History reviewed. No pertinent family history.  Social History Social History   Tobacco Use  . Smoking status: Passive Smoke Exposure - Never Smoker  . Smokeless tobacco: Never Used  Vaping Use  . Vaping Use: Unknown  Substance Use Topics  . Alcohol use: Yes    Comment: occ  . Drug use: Yes    Types: Marijuana    Comment: medical marijuana    Review of Systems  Constitutional: No fever/chills ENT: No sore throat. Cardiovascular: Denies chest pain. Respiratory: Denies shortness of breath. Gastrointestinal: No abdominal pain.  No nausea, no vomiting.  No diarrhea.    ____________________________________________   PHYSICAL EXAM:  VITAL SIGNS: ED Triage Vitals  Enc Vitals Group     BP 01/15/20 0815 (!) 145/83     Pulse Rate 01/15/20 0815 75     Resp 01/15/20 0815 18     Temp 01/15/20 0815 98.6 F (37 C)     Temp Source 01/15/20  0815 Oral     SpO2 01/15/20 0815 98 %     Weight 01/15/20 0816 165 lb (74.8 kg)     Height 01/15/20 0816 6\' 1"  (1.854 m)   Constitutional: Alert and oriented. Well appearing and in no acute distress. Eyes: Conjunctivae are normal.  Head: Atraumatic. Nose: No congestion/rhinnorhea. Mouth/Throat: Mucous membranes are moist.  Neck: No stridor.   Cardiovascular: Normal rate, regular rhythm. Good peripheral circulation. Grossly normal heart sounds.   Respiratory: Normal respiratory effort.  No retractions. Lungs CTAB. Gastrointestinal: Soft and nontender. No distention.  Musculoskeletal: No gross deformities of extremities. Neurologic:  Normal speech and language. Skin:  Skin is warm, dry and intact. No rash noted.  ____________________________________________   PROCEDURES  Procedure(s) performed:   Procedures  None  ____________________________________________   INITIAL IMPRESSION / ASSESSMENT AND PLAN / ED COURSE  Pertinent labs & imaging results that were available during my care of the patient were reviewed by me and considered in my medical decision making (see chart for details).   Patient presents to the emergency department after finding a roach in his fast food bag.  He is requesting that we induce vomiting or otherwise bumped his stomach.  We discussed that this is not indicated and carries with it risk of potential  complications.  Patient will monitor symptoms at home and follow closely with his primary care doctor as needed.    ____________________________________________  FINAL CLINICAL IMPRESSION(S) / ED DIAGNOSES  Final diagnoses:  Encounter for medical screening examination    Note:  This document was prepared using Dragon voice recognition software and may include unintentional dictation errors.  Alona Bene, MD, [facility] Emergency Medicine    Aubery Douthat, Arlyss Repress, MD 01/15/20 [phone]

## 2020-01-15 NOTE — ED Triage Notes (Signed)
Pt states he went to [employer] and found a roach mixed in with his food; pt denies any pain just states "I feel like I need to throw everything back up"

## 2020-01-15 NOTE — Discharge Instructions (Signed)
Please follow up with your PCP in the coming week. Return to the ED with any new or worsening symptoms.

## 2020-01-15 NOTE — ED Notes (Signed)
Prior to RN discharging pt, pt was seen walking out of room while on cell phone and walked to EMS bay door and walked out shaking his head. RN called out twice for pt as he was walking out to offer assistance for something he possibly needed, but pt did not respond and continued to walked out.

## 2020-06-12 ENCOUNTER — Emergency Department (HOSPITAL_COMMUNITY)
Admission: EM | Admit: 2020-06-12 | Discharge: 2020-06-12 | Disposition: A | Discharge status: Home / Self Care | Payer: Medicare Other | Attending: Emergency Medicine | Admitting: Emergency Medicine

## 2020-06-12 ENCOUNTER — Other Ambulatory Visit: Payer: Self-pay

## 2020-06-12 ENCOUNTER — Encounter (HOSPITAL_COMMUNITY): Payer: Self-pay | Admitting: Emergency Medicine

## 2020-06-12 DIAGNOSIS — R059 Cough, unspecified: Secondary | ICD-10-CM | POA: Diagnosis not present

## 2020-06-12 DIAGNOSIS — Z5321 Procedure and treatment not carried out due to patient leaving prior to being seen by health care provider: Secondary | ICD-10-CM | POA: Insufficient documentation

## 2020-06-12 DIAGNOSIS — R110 Nausea: Secondary | ICD-10-CM | POA: Diagnosis not present

## 2020-06-12 DIAGNOSIS — R109 Unspecified abdominal pain: Secondary | ICD-10-CM | POA: Insufficient documentation

## 2020-06-12 NOTE — ED Triage Notes (Signed)
Pt c/o abdominal since Monday. Pt endorses nausea but states he has been unable to vomit.  Pt states his last normal BM was on Friday or Saturday.  Pt has a cough denies known covid exposure.

## 2020-06-19 ENCOUNTER — Encounter: Payer: Self-pay | Admitting: Internal Medicine

## 2020-06-19 ENCOUNTER — Ambulatory Visit (INDEPENDENT_AMBULATORY_CARE_PROVIDER_SITE_OTHER): Payer: Medicare Other | Admitting: Internal Medicine

## 2020-06-19 ENCOUNTER — Other Ambulatory Visit: Payer: Self-pay

## 2020-06-19 VITALS — BP 126/79 | HR 65 | Temp 98.1°F | Ht 73.0 in | Wt 370.0 lb

## 2020-06-19 DIAGNOSIS — Z7689 Persons encountering health services in other specified circumstances: Secondary | ICD-10-CM | POA: Diagnosis not present

## 2020-06-19 DIAGNOSIS — M75101 Unspecified rotator cuff tear or rupture of right shoulder, not specified as traumatic: Secondary | ICD-10-CM | POA: Diagnosis not present

## 2020-06-19 DIAGNOSIS — F1190 Opioid use, unspecified, uncomplicated: Secondary | ICD-10-CM

## 2020-06-19 DIAGNOSIS — K219 Gastro-esophageal reflux disease without esophagitis: Secondary | ICD-10-CM | POA: Diagnosis not present

## 2020-06-19 DIAGNOSIS — Z2820 Immunization not carried out because of patient decision for unspecified reason: Secondary | ICD-10-CM

## 2020-06-19 DIAGNOSIS — F418 Other specified anxiety disorders: Secondary | ICD-10-CM

## 2020-06-19 DIAGNOSIS — Z1211 Encounter for screening for malignant neoplasm of colon: Secondary | ICD-10-CM

## 2020-06-19 DIAGNOSIS — J4520 Mild intermittent asthma, uncomplicated: Secondary | ICD-10-CM

## 2020-06-19 DIAGNOSIS — M109 Gout, unspecified: Secondary | ICD-10-CM

## 2020-06-19 DIAGNOSIS — F29 Unspecified psychosis not due to a substance or known physiological condition: Secondary | ICD-10-CM

## 2020-06-19 MED ORDER — ALBUTEROL SULFATE HFA 108 (90 BASE) MCG/ACT IN AERS: 2.0000 | INHALATION_SPRAY | Freq: Four times a day (QID) | RESPIRATORY_TRACT | 5 refills | Status: DC | PRN

## 2020-06-19 NOTE — Assessment & Plan Note (Signed)
Reports history of right rotator cuff On Norco for chronic pain management, PDMP reviewed, which does not show any Norco prescription recently. Patient wanted refill of Norco, advised him that since this is his first appointment and not familiar with his imaging, would refer him for Orthopedic surgery evaluation first

## 2020-06-19 NOTE — Assessment & Plan Note (Signed)
On Pantoprazole 

## 2020-06-19 NOTE — Assessment & Plan Note (Signed)
Refused COVID and flu vaccine

## 2020-06-19 NOTE — Assessment & Plan Note (Signed)
Reports h/o gout, multiple flares Has been taking Prednisone repeatedly No h/o CKD or cardiac problem Would prescribed Colchicine or NSAID for acute gout Check uric acid before starting chronic treatment

## 2020-06-19 NOTE — Assessment & Plan Note (Signed)
On Norco chronically for rotator cuff tear according to the patient Offered pain management referral, which he is not interested in. PDMP review does not reveal any recent fills.

## 2020-06-19 NOTE — Assessment & Plan Note (Signed)
On Prozac Xanax 2 mg TID Follows up with Psychiatrist in [place] 

## 2020-06-19 NOTE — Assessment & Plan Note (Signed)
Albuterol inhaler PRN, refills provided.

## 2020-06-19 NOTE — Assessment & Plan Note (Signed)
Unknown psychiatric disorder, takes Ziprasidone History is malaligned multiple times during the conversation. Patient not sure of the exact diagnosis. Follows up with Psychiatrist

## 2020-06-19 NOTE — Progress Notes (Signed)
New Patient Office Visit  Subjective:  Patient ID: Martin Bruce, male    DOB: May 13, 1976  Age: 45 y.o. MRN: [id number]  CC:  Chief Complaint  Patient presents with  . New Patient (Initial Visit)    Here to establish care, has several complaints today  . Constipation    Ongoing x2-3 weeks  . Nausea    Ongoing x2-3 weeks, constant throughout the day    HPI VERNICE Bruce is a 45 year old male with PMH of asthma, depression, anxiety, unknown psychotic disorder, chronic gout, GERD and morbid obesity who presents for establishing care.  He states that he moved from [place] in 2018, but had been going back there frequently and visited his PCP there.  He follows up with Psychiatry for anxiety and depression. He takes Alprazolam, Prozac and Ziprasidone. He is not sure of exact diagnosis for which he takes Ziprasidone.  He c/o acid reflux, for which he takes Pantoprazole. He c/o nausea and chronic constipation. He wants to be referred to GI for evaluation and screening colonoscopy.  He reports h/o rotator cuff tear, but indicates it being around the neck area. He requests his Norco to be refilled. PDMP review suggests no recent fills. He does not appear to be in distress currently. He states that he was told that he does not need surgery for rotator cuff tear (2012) and has been being managed with opioid medications. He does not want to be referred to pain management.  He reports family h/o colon cancer. He also had rectal bleeding, for which he had colonoscopy about 10 years ago, which was normal according to the patient. GI referral provided.  He reports h/o asthma and requests refills for Albuterol inhaler.  He has not had COVID vaccine or flu vaccine.  Past Medical History:  Diagnosis Date  . Anxiety   . Asthma   . Gout     Past Surgical History:  Procedure Laterality Date  . CARPAL TUNNEL RELEASE    . CHOLECYSTECTOMY     denies    History reviewed. No pertinent  family history.  Social History   Socioeconomic History  . Marital status: Single    Spouse name: Not on file  . Number of children: Not on file  . Years of education: Not on file  . Highest education level: Not on file  Occupational History  . Not on file  Tobacco Use  . Smoking status: Passive Smoke Exposure - Never Smoker  . Smokeless tobacco: Never Used  Vaping Use  . Vaping Use: Never used  Substance and Sexual Activity  . Alcohol use: Yes    Comment: occ  . Drug use: Yes    Types: Marijuana    Comment: medical marijuana  . Sexual activity: Not Currently  Other Topics Concern  . Not on file  Social History Narrative  . Not on file   Social Determinants of Health   Financial Resource Strain: Not on file  Food Insecurity: Not on file  Transportation Needs: Not on file  Physical Activity: Not on file  Stress: Not on file  Social Connections: Not on file  Intimate Partner Violence: Not on file    ROS Review of Systems  Constitutional: Negative for chills and fever.  HENT: Negative for congestion and sore throat.   Eyes: Negative for pain and discharge.  Respiratory: Negative for cough and shortness of breath.   Cardiovascular: Negative for chest pain and palpitations.  Gastrointestinal: Positive for constipation and nausea.  Negative for diarrhea and vomiting.  Endocrine: Negative for polydipsia and polyuria.  Genitourinary: Negative for dysuria and hematuria.  Musculoskeletal: Positive for arthralgias. Negative for neck pain and neck stiffness.  Skin: Negative for rash.  Neurological: Negative for dizziness, weakness, numbness and headaches.  Psychiatric/Behavioral: Negative for agitation and behavioral problems.    Objective:   Today's Vitals: BP 126/79 (BP Location: Right Arm, Patient Position: Sitting, Cuff Size: Normal)   Pulse 65   Temp 98.1 F (36.7 C) (Temporal)   Ht 6' 1" (1.854 m)   Wt (!) 370 lb (167.8 kg)   SpO2 97%   BMI 48.82 kg/m    Physical Exam Vitals reviewed.  Constitutional:      General: He is not in acute distress.    Appearance: He is obese. He is not diaphoretic.  HENT:     Head: Normocephalic and atraumatic.     Nose: Nose normal.     Mouth/Throat:     Mouth: Mucous membranes are moist.  Eyes:     General: No scleral icterus.    Extraocular Movements: Extraocular movements intact.     Pupils: Pupils are equal, round, and reactive to light.  Cardiovascular:     Rate and Rhythm: Normal rate and regular rhythm.     Pulses: Normal pulses.     Heart sounds: Normal heart sounds. No murmur heard.   Pulmonary:     Breath sounds: Normal breath sounds. No wheezing or rales.  Abdominal:     Palpations: Abdomen is soft.     Tenderness: There is no abdominal tenderness.  Musculoskeletal:     Cervical back: Neck supple. No tenderness.     Right lower leg: No edema.     Left lower leg: No edema.  Skin:    General: Skin is warm.     Findings: No rash.  Neurological:     General: No focal deficit present.     Mental Status: He is alert and oriented to person, place, and time.     Sensory: No sensory deficit.     Motor: No weakness.  Psychiatric:        Mood and Affect: Mood normal.        Behavior: Behavior normal.     Assessment & Plan:   Problem List Items Addressed This Visit      Encounter to establish care - Primary   Care established Previous chart reviewed History and medications reviewed with the patient      Relevant Orders  CBC with Differential  CMP14+EGFR  Hemoglobin A1c  Lipid Profile  TSH  Vitamin D (25 hydroxy)  Uric acid    Respiratory   Mild intermittent asthma without complication    Albuterol inhaler PRN, refills provided.      Relevant Medications   albuterol (VENTOLIN HFA) 108 (90 Base) MCG/ACT inhaler     Digestive   Gastroesophageal reflux disease    On Pantoprazole        Musculoskeletal and Integument   Tear of right rotator cuff    Reports  history of right rotator cuff On Norco for chronic pain management, PDMP reviewed, which does not show any Norco prescription recently. Patient wanted refill of Norco, advised him that since this is his first appointment and not familiar with his imaging, would refer him for Orthopedic surgery evaluation first      Relevant Orders   Ambulatory referral to Orthopedic Surgery     Other      Anxiety  with depression    On Prozac Xanax 2 mg TID Follows up with Psychiatrist in [place]      Relevant Medications   albuterol (VENTOLIN HFA) 108 (90 Base) MCG/ACT inhaler   Chronic, continuous use of opioids    On Norco chronically for rotator cuff tear according to the patient Offered pain management referral, which he is not interested in. PDMP review does not reveal any recent fills.      Gout    Reports h/o gout, multiple flares Has been taking Prednisone repeatedly No h/o CKD or cardiac problem Would prescribed Colchicine or NSAID for acute gout Check uric acid before starting chronic treatment      Vaccine refused by patient    Refused COVID and flu vaccine      Psychosis ([place])    Unknown psychiatric disorder, takes Ziprasidone History is malaligned multiple times during the conversation. Patient not sure of the exact diagnosis. Follows up with Psychiatrist        Other Visit Diagnoses    Encounter for screening colonoscopy       Relevant Orders   Ambulatory referral to Gastroenterology      Outpatient Encounter Medications as of 06/19/2020  Medication Sig  . albuterol (PROVENTIL) (2.5 MG/3ML) 0.083% nebulizer solution Take 2.5 mg by nebulization every 6 (six) hours as needed for wheezing or shortness of breath.  . alprazolam (XANAX) 2 MG tablet Take 2 mg by mouth 3 (three) times daily.  Marland Kitchen FLUoxetine (PROZAC) 20 MG capsule Take 20 mg by mouth daily.  Marland Kitchen HYDROcodone-acetaminophen (NORCO/VICODIN) 5-325 MG tablet Take 1 tablet by mouth every 6 (six) hours as needed.  .  ondansetron (ZOFRAN ODT) 4 MG disintegrating tablet Take 1 tablet (4 mg total) by mouth every 8 (eight) hours as needed for nausea or vomiting.  . pantoprazole (PROTONIX) 20 MG tablet Take 1 tablet (20 mg total) by mouth daily.  . sucralfate (CARAFATE) 1 g tablet Take 1 tablet (1 g total) by mouth 2 (two) times daily.  . ziprasidone (GEODON) 80 MG capsule Take 80 mg by mouth at bedtime.  . [DISCONTINUED] albuterol (PROVENTIL HFA;VENTOLIN HFA) 108 (90 BASE) MCG/ACT inhaler Inhale 1 puff into the lungs every 6 (six) hours as needed for wheezing or shortness of breath.  . [DISCONTINUED] colchicine 0.6 MG tablet Take 1 tablet (0.6 mg total) by mouth daily.  . [DISCONTINUED] diazepam (VALIUM) 10 MG tablet Take 20 mg by mouth every 6 (six) hours as needed for anxiety.  . [DISCONTINUED] doxycycline (VIBRAMYCIN) 100 MG capsule Take 1 capsule (100 mg total) by mouth 2 (two) times daily.  . [DISCONTINUED] famotidine (PEPCID) 20 MG tablet Take 1 tablet (20 mg total) by mouth 2 (two) times daily.  . [DISCONTINUED] ondansetron (ZOFRAN) 4 MG tablet Take 1 tablet (4 mg total) by mouth every 6 (six) hours.  . [DISCONTINUED] predniSONE (DELTASONE) 50 MG tablet 1 tablet PO daily  . albuterol (VENTOLIN HFA) 108 (90 Base) MCG/ACT inhaler Inhale 2 puffs into the lungs every 6 (six) hours as needed for wheezing or shortness of breath.   No facility-administered encounter medications on file as of 06/19/2020.    Follow-up: Return in about 6 months (around 12/17/2020).   Lindell Spar, MD

## 2020-06-19 NOTE — Assessment & Plan Note (Signed)
Care established Previous chart reviewed History and medications reviewed with the patient 

## 2020-06-19 NOTE — Patient Instructions (Signed)
Please continue to take medications as prescribed.  Please follow up with Orthopedic surgeon and GI as scheduled.  Please get fasting blood tests done within a week.

## 2020-06-25 ENCOUNTER — Ambulatory Visit: Payer: Medicare Other | Admitting: Orthopaedic Surgery

## 2020-06-27 ENCOUNTER — Encounter (INDEPENDENT_AMBULATORY_CARE_PROVIDER_SITE_OTHER): Payer: Self-pay | Admitting: *Deleted

## 2020-07-04 ENCOUNTER — Ambulatory Visit (INDEPENDENT_AMBULATORY_CARE_PROVIDER_SITE_OTHER): Payer: Medicare Other | Admitting: Orthopaedic Surgery

## 2020-07-04 ENCOUNTER — Other Ambulatory Visit: Payer: Self-pay

## 2020-07-04 ENCOUNTER — Ambulatory Visit: Payer: Medicare Other

## 2020-07-04 ENCOUNTER — Encounter: Payer: Self-pay | Admitting: Orthopaedic Surgery

## 2020-07-04 VITALS — BP 139/82 | HR 74 | Ht 73.0 in | Wt 365.0 lb

## 2020-07-04 DIAGNOSIS — G8929 Other chronic pain: Secondary | ICD-10-CM | POA: Diagnosis not present

## 2020-07-04 DIAGNOSIS — M25511 Pain in right shoulder: Secondary | ICD-10-CM | POA: Diagnosis not present

## 2020-07-04 DIAGNOSIS — Z6841 Body Mass Index (BMI) 40.0 and over, adult: Secondary | ICD-10-CM | POA: Diagnosis not present

## 2020-07-04 MED ORDER — HYDROCODONE-ACETAMINOPHEN 5-325 MG PO TABS: 1.0000 | ORAL_TABLET | ORAL | 0 refills | Status: AC | PRN

## 2020-07-04 MED ORDER — NAPROXEN 500 MG PO TABS: 500.0000 mg | ORAL_TABLET | Freq: Two times a day (BID) | ORAL | 5 refills | Status: DC

## 2020-07-04 NOTE — Progress Notes (Signed)
Subjective:    Patient ID: Martin Bruce, male    DOB: February 01, 1976, 45 y.o.   MRN: [id number]  HPI He hurt his shoulder in 2013 on the right while working in [place] for [employer].  He was told he had a rotator cuff tear.  He did not have surgery.  The right shoulder has flared up now and then since then but over the last two to three months it has flared up again but not getting any better.  He has no new trauma.  He is originally from [place] and came back to the area. He is on Medicare Disability.  He was seen at [facility] by Dr. Allena Katz on 06-19-20.  I have reviewed the notes.  Referral was made here.  He has no new trauma.  He has no redness or numbness.  He is not sleeping well because of the pain.  He cannot raise his arm over his head well.  He can do it but it hurts.  He has tried heat, Advil, rest, rubs with no help.   Review of Systems  Constitutional: Positive for activity change.  Respiratory: Positive for shortness of breath.   Musculoskeletal: Positive for arthralgias and myalgias.  Psychiatric/Behavioral: The patient is nervous/anxious.   All other systems reviewed and are negative.  For Review of Systems, all other systems reviewed and are negative.  The following is a summary of the past history medically, past history surgically, known current medicines, social history and family history.  This information is gathered electronically by the computer from prior information and documentation.  I review this each visit and have found including this information at this point in the chart is beneficial and informative.   Past Medical History:  Diagnosis Date  . Anxiety   . Asthma   . Gout     Past Surgical History:  Procedure Laterality Date  . CARPAL TUNNEL RELEASE    . CHOLECYSTECTOMY     denies    Current Outpatient Medications on File Prior to Visit  Medication Sig Dispense Refill  . albuterol (PROVENTIL) (2.5 MG/3ML) 0.083% nebulizer  solution Take 2.5 mg by nebulization every 6 (six) hours as needed for wheezing or shortness of breath.    Marland Kitchen albuterol (VENTOLIN HFA) 108 (90 Base) MCG/ACT inhaler Inhale 2 puffs into the lungs every 6 (six) hours as needed for wheezing or shortness of breath. 18 g 5  . alprazolam (XANAX) 2 MG tablet Take 2 mg by mouth 3 (three) times daily.    . cyclobenzaprine (FLEXERIL) 10 MG tablet Take by mouth.    Marland Kitchen FLUoxetine (PROZAC) 20 MG capsule Take 20 mg by mouth daily.    . fluticasone (FLOVENT HFA) 110 MCG/ACT inhaler Inhale into the lungs.    . ondansetron (ZOFRAN ODT) 4 MG disintegrating tablet Take 1 tablet (4 mg total) by mouth every 8 (eight) hours as needed for nausea or vomiting. 4 tablet 0  . pantoprazole (PROTONIX) 20 MG tablet Take 1 tablet (20 mg total) by mouth daily. 30 tablet 0  . sucralfate (CARAFATE) 1 g tablet Take 1 tablet (1 g total) by mouth 2 (two) times daily. 20 tablet 0  . triamcinolone (KENALOG) 0.1 % Apply topically.    . ziprasidone (GEODON) 80 MG capsule Take 80 mg by mouth at bedtime.     No current facility-administered medications on file prior to visit.    Social History   Socioeconomic History  . Marital status: Single  Spouse name: Not on file  . Number of children: Not on file  . Years of education: Not on file  . Highest education level: Not on file  Occupational History  . Not on file  Tobacco Use  . Smoking status: Passive Smoke Exposure - Never Smoker  . Smokeless tobacco: Never Used  Vaping Use  . Vaping Use: Never used  Substance and Sexual Activity  . Alcohol use: Yes    Comment: occ  . Drug use: Yes    Types: Marijuana    Comment: medical marijuana  . Sexual activity: Not Currently  Other Topics Concern  . Not on file  Social History Narrative  . Not on file   Social Determinants of Health   Financial Resource Strain: Not on file  Food Insecurity: Not on file  Transportation Needs: Not on file  Physical Activity: Not on file   Stress: Not on file  Social Connections: Not on file  Intimate Partner Violence: Not on file    History reviewed. No pertinent family history.  BP 139/82   Pulse 74   Ht 6\' 1"  (1.854 m)   Wt (!) 365 lb (165.6 kg)   BMI 48.16 kg/m   Body mass index is 48.16 kg/m.      Objective:   Physical Exam Vitals and nursing note reviewed. Exam conducted with a chaperone present.  Constitutional:      Appearance: He is well-developed and well-nourished.  HENT:     Head: Normocephalic and atraumatic.  Eyes:     Extraocular Movements: EOM normal.     Conjunctiva/sclera: Conjunctivae normal.     Pupils: Pupils are equal, round, and reactive to light.  Cardiovascular:     Rate and Rhythm: Normal rate and regular rhythm.     Pulses: Intact distal pulses.  Pulmonary:     Effort: Pulmonary effort is normal.  Abdominal:     Palpations: Abdomen is soft.  Musculoskeletal:       Arms:     Cervical back: Normal range of motion and neck supple.  Skin:    General: Skin is warm and dry.  Neurological:     Mental Status: He is alert and oriented to person, place, and time.     Cranial Nerves: No cranial nerve deficit.     Motor: No abnormal muscle tone.     Coordination: Coordination normal.     Deep Tendon Reflexes: Reflexes are normal and symmetric. Reflexes normal.  Psychiatric:        Mood and Affect: Mood and affect normal.        Behavior: Behavior normal.        Thought Content: Thought content normal.        Judgment: Judgment normal.    X-rays were done of the right shoulder, reported separately.      Assessment & Plan:   Encounter Diagnoses  Name Primary?  . Chronic right shoulder pain Yes  . Body mass index 45.0-49.9, adult (HCC)   . Morbid obesity (HCC)    I will get MRI of the right shoulder.  He has history of rotator cuff tear and is not getting better.  I will call in Naprosyn 500 and Norco.  He declines injection.  He said they did not help in the past.   That is understandable.  I have reviewed the 11-01-1983 Controlled Substance Reporting System web site prior to prescribing narcotic medicine for this patient.   Return after the MRI.  Call  if any problem.  Precautions discussed.   Electronically Signed Darreld Mclean, MD 1/27/202210:52 AM

## 2020-07-18 ENCOUNTER — Ambulatory Visit: Payer: Medicare Other | Admitting: Orthopaedic Surgery

## 2020-08-02 ENCOUNTER — Ambulatory Visit
Admission: RE | Admit: 2020-08-02 | Discharge: 2020-08-02 | Disposition: A | Discharge status: Home / Self Care | Payer: Medicare Other | Source: Ambulatory Visit | Attending: Orthopaedic Surgery | Admitting: Orthopaedic Surgery

## 2020-08-02 DIAGNOSIS — G8929 Other chronic pain: Secondary | ICD-10-CM

## 2020-08-02 DIAGNOSIS — M25511 Pain in right shoulder: Secondary | ICD-10-CM

## 2020-08-06 ENCOUNTER — Other Ambulatory Visit: Payer: Self-pay

## 2020-08-06 ENCOUNTER — Encounter: Payer: Self-pay | Admitting: Orthopaedic Surgery

## 2020-08-06 ENCOUNTER — Ambulatory Visit (INDEPENDENT_AMBULATORY_CARE_PROVIDER_SITE_OTHER): Payer: Medicare Other | Admitting: Orthopaedic Surgery

## 2020-08-06 VITALS — Ht 73.0 in | Wt 370.0 lb

## 2020-08-06 DIAGNOSIS — G8929 Other chronic pain: Secondary | ICD-10-CM

## 2020-08-06 DIAGNOSIS — M25511 Pain in right shoulder: Secondary | ICD-10-CM | POA: Diagnosis not present

## 2020-08-06 DIAGNOSIS — Z6841 Body Mass Index (BMI) 40.0 and over, adult: Secondary | ICD-10-CM

## 2020-08-06 MED ORDER — HYDROCODONE-ACETAMINOPHEN 5-325 MG PO TABS: 1.0000 | ORAL_TABLET | ORAL | 0 refills | Status: AC | PRN

## 2020-08-06 NOTE — Progress Notes (Signed)
Patient WH:QPRFFM Martin Bruce, male DOB:[birth year], 45 y.o. BWG:[id number]  Chief Complaint  Patient presents with  . Shoulder Pain    Rt shoulder     HPI  Martin Bruce is a 45 y.o. male who has right shoulder pain. He had MRI which showed: IMPRESSION: 1. Image quality degraded by body habitus and motion. 2. Linear sentinel cyst formation in the infraspinatus muscle without evidence of corresponding rotator cuff tear. The biceps tendon and labrum appear intact. 3. Mild acromioclavicular degenerative changes.  I have independently reviewed the MRI.       I have explained the findings to him.  I will have him start OT.  He is going to [place] in two weeks.  I will have him return in three weeks.  Do the exercises on the trip.   Body mass index is 48.82 kg/m.  The patient meets the AMA guidelines for Morbid (severe) obesity with a BMI > 40.0 and I have recommended weight loss.   ROS  Review of Systems  Constitutional: Positive for activity change.  Respiratory: Positive for shortness of breath.   Musculoskeletal: Positive for arthralgias and myalgias.  Psychiatric/Behavioral: The patient is nervous/anxious.   All other systems reviewed and are negative.   All other systems reviewed and are negative.  The following is a summary of the past history medically, past history surgically, known current medicines, social history and family history.  This information is gathered electronically by the computer from prior information and documentation.  I review this each visit and have found including this information at this point in the chart is beneficial and informative.    Past Medical History:  Diagnosis Date  . Anxiety   . Asthma   . Gout     Past Surgical History:  Procedure Laterality Date  . CARPAL TUNNEL RELEASE    . CHOLECYSTECTOMY     denies    History reviewed. No pertinent family history.  Social History Social History   Tobacco Use  . Smoking  status: Passive Smoke Exposure - Never Smoker  . Smokeless tobacco: Never Used  Vaping Use  . Vaping Use: Never used  Substance Use Topics  . Alcohol use: Yes    Comment: occ  . Drug use: Yes    Types: Marijuana    Comment: medical marijuana    Allergies  Allergen Reactions  . Penicillins Rash    Current Outpatient Medications  Medication Sig Dispense Refill  . albuterol (PROVENTIL) (2.5 MG/3ML) 0.083% nebulizer solution Take 2.5 mg by nebulization every 6 (six) hours as needed for wheezing or shortness of breath.    Marland Kitchen albuterol (VENTOLIN HFA) 108 (90 Base) MCG/ACT inhaler Inhale 2 puffs into the lungs every 6 (six) hours as needed for wheezing or shortness of breath. 18 g 5  . alprazolam (XANAX) 2 MG tablet Take 2 mg by mouth 3 (three) times daily.    . cyclobenzaprine (FLEXERIL) 10 MG tablet Take by mouth.    Marland Kitchen FLUoxetine (PROZAC) 20 MG capsule Take 20 mg by mouth daily.    . fluticasone (FLOVENT HFA) 110 MCG/ACT inhaler Inhale into the lungs.    Marland Kitchen HYDROcodone-acetaminophen (NORCO/VICODIN) 5-325 MG tablet Take 1 tablet by mouth every 4 (four) hours as needed for up to 5 days for moderate pain. 30 tablet 0  . naproxen (NAPROSYN) 500 MG tablet Take 1 tablet (500 mg total) by mouth 2 (two) times daily with a meal. 60 tablet 5  . ondansetron (ZOFRAN ODT) 4 MG  disintegrating tablet Take 1 tablet (4 mg total) by mouth every 8 (eight) hours as needed for nausea or vomiting. 4 tablet 0  . pantoprazole (PROTONIX) 20 MG tablet Take 1 tablet (20 mg total) by mouth daily. 30 tablet 0  . sucralfate (CARAFATE) 1 g tablet Take 1 tablet (1 g total) by mouth 2 (two) times daily. 20 tablet 0  . triamcinolone (KENALOG) 0.1 % Apply topically.    . ziprasidone (GEODON) 80 MG capsule Take 80 mg by mouth at bedtime.     No current facility-administered medications for this visit.     Physical Exam  Height 6\' 1"  (1.854 m), weight (!) 370 lb (167.8 kg).  Constitutional: overall normal hygiene,  normal nutrition, well developed, normal grooming, normal body habitus. Assistive device:none  Musculoskeletal: gait and station Limp none, muscle tone and strength are normal, no tremors or atrophy is present.  .  Neurological: coordination overall normal.  Deep tendon reflex/nerve stretch intact.  Sensation normal.  Cranial nerves II-XII intact.   Skin:   Normal overall no scars, lesions, ulcers or rashes. No psoriasis.  Psychiatric: Alert and oriented x 3.  Recent memory intact, remote memory unclear.  Normal mood and affect. Well groomed.  Good eye contact.  Cardiovascular: overall no swelling, no varicosities, no edema bilaterally, normal temperatures of the legs and arms, no clubbing, cyanosis and good capillary refill.  Lymphatic: palpation is normal.  Right shoulder with pain in extremes.  NV intact.  All other systems reviewed and are negative   The patient has been educated about the nature of the problem(s) and counseled on treatment options.  The patient appeared to understand what I have discussed and is in agreement with it.  Encounter Diagnoses  Name Primary?  . Chronic right shoulder pain Yes  . Body mass index 45.0-49.9, adult (HCC)   . Morbid obesity (HCC)     PLAN Call if any problems.  Precautions discussed.  Continue current medications.   Return to clinic 3 weeks   I have reviewed the [facility] Controlled Substance Reporting System web site prior to prescribing narcotic medicine for this patient.   Electronically Signed [facility], MD 3/1/20229:49 AM

## 2020-08-07 ENCOUNTER — Ambulatory Visit (HOSPITAL_COMMUNITY): Payer: Medicare Other | Attending: Orthopaedic Surgery

## 2020-08-07 DIAGNOSIS — M25511 Pain in right shoulder: Secondary | ICD-10-CM | POA: Insufficient documentation

## 2020-08-07 DIAGNOSIS — M25611 Stiffness of right shoulder, not elsewhere classified: Secondary | ICD-10-CM | POA: Insufficient documentation

## 2020-08-07 DIAGNOSIS — R29898 Other symptoms and signs involving the musculoskeletal system: Secondary | ICD-10-CM | POA: Insufficient documentation

## 2020-08-09 ENCOUNTER — Encounter (HOSPITAL_COMMUNITY): Payer: Self-pay | Admitting: Specialist

## 2020-08-09 ENCOUNTER — Other Ambulatory Visit: Payer: Self-pay

## 2020-08-09 ENCOUNTER — Ambulatory Visit (HOSPITAL_COMMUNITY): Payer: Medicare Other | Admitting: Specialist

## 2020-08-09 DIAGNOSIS — M25511 Pain in right shoulder: Secondary | ICD-10-CM | POA: Diagnosis present

## 2020-08-09 DIAGNOSIS — M25611 Stiffness of right shoulder, not elsewhere classified: Secondary | ICD-10-CM

## 2020-08-09 DIAGNOSIS — R29898 Other symptoms and signs involving the musculoskeletal system: Secondary | ICD-10-CM

## 2020-08-09 NOTE — Patient Instructions (Signed)

## 2020-08-12 NOTE — Therapy (Signed)
[facility] [address] [place], [place], [address] Phone: [phone]   Fax:  [phone]  Occupational Therapy Evaluation  Patient Details  Name: Martin Bruce MRN: [id number] Date of Birth: 09-13-1975 Referring Provider (OT): Dr. Darreld Mclean   Encounter Date: 08/09/2020   OT End of Session - 08/12/20 0836    Visit Number 1    Number of Visits 16    Date for OT Re-Evaluation 10/04/20   mini reassess on 09/06/20   Authorization Type [employer] Medicare    Progress Note Due on Visit 10    OT Start Time 1520    OT Stop Time 1600    OT Time Calculation (min) 40 min    Activity Tolerance Patient tolerated treatment well    Behavior During Therapy [facility] for tasks assessed/performed           Past Medical History:  Diagnosis Date  . Anxiety   . Asthma   . Gout     Past Surgical History:  Procedure Laterality Date  . CARPAL TUNNEL RELEASE    . CHOLECYSTECTOMY     denies    There were no vitals filed for this visit.   Subjective Assessment - 08/12/20 0837    Subjective  S:  My arm has hurt for a long time.  The cold air especially makes it hurt.    Pertinent History Martin Bruce reports that his shoulder has hurt for several years.  He worked at [employer] and this caused the pain in his shoulder.  He is now on full disability and is no longer completing this job.  He consulted with Dr. 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  He has been referred to occupational therapy for evaluation and treatment.    Special Tests FOTO:  64.90% independent    Patient Stated Goals I want the pain to be gone    Currently in Pain? Yes    Pain Score 4     Pain Location Shoulder    Pain Descriptors / Indicators Aching    Pain Type Chronic pain    Pain Onset More than a month ago    Pain Frequency Intermittent    Aggravating Factors  cod air, movement    Pain Relieving Factors heat     Effect of Pain on Daily Activities min-mod             [facility] OT Assessment - 08/12/20 0001      Assessment   Medical Diagnosis Right Shoulder Pain    Referring Provider (OT) Dr. Darreld Mclean    Onset Date/Surgical Date --   chronic   Hand Dominance Right    Next MD Visit 08/27/20      Precautions   Precautions None      Restrictions   Weight Bearing Restrictions No      Balance Screen   Has the patient fallen in the past 6 months No    Has the patient had a decrease in activity level because of a fear of falling?  No    Is the patient reluctant to leave their home because of a fear of falling?  No      Home  Environment   Family/patient expects to be discharged to: Private residence      Prior Function   Level of Independence Independent    Vocation On disability    Leisure baking and  traveling      ADL   ADL comments patient has increased pain with cold air, sleeping on his side and stomach.  he has pain reaching overhead, behind back, and running his mixer when baking is very painful      Vision - History   Baseline Vision No visual deficits      Cognition   Overall Cognitive Status Within Functional Limits for tasks assessed      Observation/Other Assessments   Focus on Therapeutic Outcomes (FOTO)  64.9% independent      Posture/Postural Control   Posture Comments slumped shoulders      Sensation   Light Touch Appears Intact      Coordination   Gross Motor Movements are Fluid and Coordinated Yes    Fine Motor Movements are Fluid and Coordinated Yes      Palpation   Palpation comment mod-max fascial restrictions noted      AROM   Overall AROM Comments assessed in seated, external and internal rotation with shoulder abducted to 90    Right Shoulder Flexion 170 Degrees   catching sensation at midrange, drop arm negative and hesitates at mid range   Right Shoulder ABduction 150 Degrees    Right Shoulder Internal Rotation 50 Degrees    Right Shoulder  External Rotation 75 Degrees      Strength   Overall Strength Comments assessed in seated, external rotation and internal rotation with shoulders adducted    Right Shoulder Flexion 4/5    Right Shoulder ABduction 4/5    Right Shoulder Internal Rotation 4/5    Right Shoulder External Rotation 4/5                    OT Treatments/Exercises (OP) - 08/12/20 0001      Manual Therapy   Manual Therapy Myofascial release    Manual therapy comments manual therapy completed seperately than all other interventions this date.    Myofascial Release myofascial release and manual stretching to decrease pain and restrictions in right shoulder, scapular, and upper arm region for improved mobility.                 OT Education - 08/12/20 0837    Education Details shoulder stretches    Person(s) Educated Patient    Methods Explanation;Demonstration;Handout    Comprehension Verbalized understanding;Returned demonstration            OT Short Term Goals - 08/09/20 1642      OT SHORT TERM GOAL #1   Title Patient will be educated and independent with HEP for improved RUE mobility.    Time 4    Period Weeks    Status New    Target Date 09/06/20      OT SHORT TERM GOAL #2   Title Patient will decrease pain to 6/10 or better when cold air hits his shoulder.    Time 4    Period Weeks    Status New      OT SHORT TERM GOAL #3   Title Patient will decrease fascial restrictions from max to mod in his right shoulder region for decreased pain and improved mobilty.    Time 4    Period Weeks    Status New             OT Long Term Goals - 08/09/20 1643      OT LONG TERM GOAL #1   Title [name] will use RUE as dominant with all desired  B/IADLs and leisure tasks.    Time 8    Period Weeks    Status New    Target Date 10/04/20      OT LONG TERM GOAL #2   Title Patient will be able to run his mixer without pain in his right arm for greater than 5 minutes.    Time 8     Period Weeks    Status New      OT LONG TERM GOAL #3   Title Patient will improve his RUE shoulder A/ROM to WNL for improved ability to reach overhead.    Time 8    Period Weeks    Status New      OT LONG TERM GOAL #4   Title Patient will improve right shoulder strength to 5/5 for improved ability to lift bags of groceries and baking supplies.    Time 8    Period Weeks    Status New      OT LONG TERM GOAL #5   Title Patient will decrease pain in his right shoulder to 2/10 or better in his right shoulder during functional tasks and when cold air hits his shoulder.    Time 8    Period Weeks    Status New                 Plan - 08/12/20 0836    Clinical Impression Statement A:  [name] is a 45 year old male with past medical history significant for headaches, CTS, asthma, back pain.  He has been experiencing increased pain and decreased mobility in his right shoulder for several years.  He has increased pain when cold air hits his shoulder.  He has increased pain when reaching overhead, running his mixer, and picking up bags of groceries.    OT Occupational Profile and History Problem Focused Assessment - Including review of records relating to presenting problem    Occupational performance deficits (Please refer to evaluation for details): ADL's;IADL's;Leisure    Body Structure / Function / Physical Skills ADL;Strength;Pain;UE functional use;ROM;IADL;Fascial restriction;Muscle spasms    Rehab Potential Good    Clinical Decision Making Limited treatment options, no task modification necessary    Comorbidities Affecting Occupational Performance: None    Modification or Assistance to Complete Evaluation  No modification of tasks or assist necessary to complete eval    OT Frequency 2x / week    OT Duration 8 weeks    OT Treatment/Interventions Self-care/ADL training;Electrical Stimulation;Therapeutic exercise;Patient/family education;Neuromuscular education;Moist Heat;Energy  conservation;Therapeutic activities;Manual Therapy;Ultrasound;Cryotherapy;DME and/or AE instruction    Plan P:  Skilled OT intervention to decrease pain and fascial restrictions and improve pain free mobility in his dominant right shoulder region.  Next session: review HEP, POC, manual therapy and progress as tolerated.    OT Home Exercise Plan initial eval:  shoulder stretches    Consulted and Agree with Plan of Care Patient           Patient will benefit from skilled therapeutic intervention in order to improve the following deficits and impairments:   Body Structure / Function / Physical Skills: ADL,Strength,Pain,UE functional use,ROM,IADL,Fascial restriction,Muscle spasms       Visit Diagnosis: Acute pain of right shoulder  Stiffness of right shoulder, not elsewhere classified  Other symptoms and signs involving the musculoskeletal system    Problem List Patient Active Problem List   Diagnosis Date Noted  . Encounter to establish care 06/19/2020  . Tear of right rotator cuff 06/19/2020  . Gastroesophageal  reflux disease 06/19/2020  . Anxiety with depression 06/19/2020  . Chronic, continuous use of opioids 06/19/2020  . Gout 06/19/2020  . Vaccine refused by patient 06/19/2020  . Mild intermittent asthma without complication 06/19/2020  . Psychosis (HCC) 06/19/2020    Shirlean Mylar, [place], OTR/L [phone]  08/12/2020, 8:38 AM  [facility] [address] [place], [place], [address] Phone: [phone]   Fax:  [phone]  Name: Martin Bruce MRN: [id number] Date of Birth: 05-30-1976

## 2020-08-13 ENCOUNTER — Ambulatory Visit (HOSPITAL_COMMUNITY): Payer: Medicare Other | Admitting: Specialist

## 2020-08-13 ENCOUNTER — Other Ambulatory Visit: Payer: Self-pay

## 2020-08-13 ENCOUNTER — Encounter (HOSPITAL_COMMUNITY): Payer: Self-pay | Admitting: Specialist

## 2020-08-13 DIAGNOSIS — M25511 Pain in right shoulder: Secondary | ICD-10-CM

## 2020-08-13 DIAGNOSIS — R29898 Other symptoms and signs involving the musculoskeletal system: Secondary | ICD-10-CM

## 2020-08-13 DIAGNOSIS — M25611 Stiffness of right shoulder, not elsewhere classified: Secondary | ICD-10-CM

## 2020-08-13 NOTE — Therapy (Signed)
[place] [place], [place], [address] Phone: [phone]   Fax:  [phone]  Occupational Therapy Treatment  Patient Details  Name: Martin Bruce MRN: [id number] Date of Birth: 01/19/1976 Referring Provider (OT): Dr. Sanjuana Kava   Encounter Date: 08/13/2020   OT End of Session - 08/13/20 2103    Visit Number 2    Number of Visits 16    Date for OT Re-Evaluation 10/04/20   mini reassess on 4/1   Authorization Type [employer] Medicare    Progress Note Due on Visit 10    OT Start Time 1518    OT Stop Time 1556    OT Time Calculation (min) 38 min    Activity Tolerance Patient tolerated treatment well    Behavior During Therapy [facility] for tasks assessed/performed           Past Medical History:  Diagnosis Date  . Anxiety   . Asthma   . Gout     Past Surgical History:  Procedure Laterality Date  . CARPAL TUNNEL RELEASE    . CHOLECYSTECTOMY     denies    There were no vitals filed for this visit.   Subjective Assessment - 08/13/20 2102    Subjective  S:  since the weather is warmer, my shoulder hasn't hurt as much.  the cold air at night still makes it ache.    Currently in Pain? Yes    Pain Score 3     Pain Location Shoulder    Pain Orientation Right    Pain Descriptors / Indicators Aching              [facility] OT Assessment - 08/13/20 0001      Assessment   Medical Diagnosis Right Shoulder Pain    Referring Provider (OT) Dr. Sanjuana Kava                    OT Treatments/Exercises (OP) - 08/13/20 0001      Exercises   Exercises Shoulder      Shoulder Exercises: Supine   Protraction PROM;AAROM;10 reps    Horizontal ABduction PROM;AAROM;10 reps    External Rotation PROM;AAROM;10 reps    Internal Rotation PROM;AAROM;10 reps    Flexion PROM;AAROM;10 reps    ABduction PROM;AAROM;10 reps      Shoulder Exercises: Seated   Elevation AROM;10 reps    Extension AROM;10 reps    Row AROM;10 reps       Shoulder Exercises: Therapy Ball   Flexion 15 reps    ABduction 15 reps      Shoulder Exercises: ROM/Strengthening   Wall Wash 1'    Thumb Tacks 1'    Prot/Ret//Elev/Dep 1' with max tactile cues and vg for technique      Manual Therapy   Manual Therapy Myofascial release    Manual therapy comments manual therapy completed seperately than all other interventions this date.    Myofascial Release myofascial release and manual stretching to decrease pain and restrictions in right shoulder, scapular, and upper arm region for improved mobility.                    OT Short Term Goals - 08/13/20 2107      OT SHORT TERM GOAL #1   Title Patient will be educated and independent with HEP for improved RUE mobility.    Time 4    Period Weeks    Status On-going  Target Date 09/06/20      OT SHORT TERM GOAL #2   Title Patient will decrease pain to 6/10 or better when cold air hits his shoulder.    Time 4    Period Weeks    Status On-going      OT SHORT TERM GOAL #3   Title Patient will decrease fascial restrictions from max to mod in his right shoulder region for decreased pain and improved mobilty.    Time 4    Period Weeks    Status On-going             OT Long Term Goals - 08/13/20 2107      OT LONG TERM GOAL #1   Title [name] will use RUE as dominant with all desired B/IADLs and leisure tasks.    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    Time 8    Period Weeks    Status On-going      OT LONG TERM GOAL #3   Title Patient will improve his RUE shoulder A/ROM to WNL for improved ability to reach overhead.    Time 8    Period Weeks    Status On-going      OT LONG TERM GOAL #4   Title Patient will improve right shoulder strength to 5/5 for improved ability to lift bags of groceries and baking supplies.    Time 8     Period Weeks    Status On-going      OT LONG TERM GOAL #5   Title Patient will decrease pain in his right shoulder to 2/10 or better in his right shoulder during functional tasks and when cold air hits his shoulder.    Time 8    Period Weeks    Status On-going                 Plan - 08/13/20 2104    Clinical Impression Statement A:  patient presents with less pain today, has been compliant with HEP.  Manual therapy to decrease fascial restrictions.  began aa/rom and proximal shoulder stabiity exercises.  patient requires min-mod vg with therapeutic exercises for form.    Body Structure / Function / Physical Skills ADL;Strength;Pain;UE functional use;ROM;IADL;Fascial restriction;Muscle spasms    Plan P:  continue manual therapy to decrease fascial restrictions, increase aa/rom repetitions, add theraband for scapular stability.           Patient will benefit from skilled therapeutic intervention in order to improve the following deficits and impairments:   Body Structure / Function / Physical Skills: ADL,Strength,Pain,UE functional use,ROM,IADL,Fascial restriction,Muscle spasms       Visit Diagnosis: Acute pain of right shoulder  Stiffness of right shoulder, not elsewhere classified  Other symptoms and signs involving the musculoskeletal system    Problem List Patient Active Problem List   Diagnosis Date Noted  . Encounter to establish care 06/19/2020  . Tear of right rotator cuff 06/19/2020  . Gastroesophageal reflux disease 06/19/2020  . Anxiety with depression 06/19/2020  . Chronic, continuous use of opioids 06/19/2020  . Gout 06/19/2020  . Vaccine refused by patient 06/19/2020  . Mild intermittent asthma without complication 73/53/2992  . Psychosis ([place]) 06/19/2020    Vangie Bicker, [place], OTR/L [phone]  08/13/2020, 9:11  PM  [place], [place], [address] Phone: [phone]   Fax:   [phone]  Name: ROYAL VANDEVOORT MRN: [id number] Date of Birth: 11/14/1975

## 2020-08-15 ENCOUNTER — Ambulatory Visit (HOSPITAL_COMMUNITY): Payer: Medicare Other | Admitting: Specialist

## 2020-08-20 ENCOUNTER — Telehealth: Payer: Self-pay | Admitting: Orthopaedic Surgery

## 2020-08-20 MED ORDER — HYDROCODONE-ACETAMINOPHEN 5-325 MG PO TABS: ORAL_TABLET | ORAL | 0 refills | Status: DC

## 2020-08-20 NOTE — Telephone Encounter (Signed)
Patient wants Dr. Hilda Lias to call him if the medicine can not be refilled, as in to soon.  He states Dr. Hilda Lias told him to call it in early and he will get it filled.   Patient phone number is [phone]

## 2020-08-20 NOTE — Telephone Encounter (Signed)
Patient needs refills on    naproxen (NAPROSYN) 500 MG tablet   HYDROcodone-acetaminophen (NORCO/VICODIN) 5-325 MG   Pharmacy:  [facility] on [address]

## 2020-08-21 ENCOUNTER — Ambulatory Visit (HOSPITAL_COMMUNITY): Payer: Medicare Other

## 2020-08-21 ENCOUNTER — Encounter (HOSPITAL_COMMUNITY): Payer: Self-pay

## 2020-08-21 ENCOUNTER — Other Ambulatory Visit: Payer: Self-pay

## 2020-08-21 ENCOUNTER — Encounter (HOSPITAL_COMMUNITY): Payer: Medicare Other

## 2020-08-21 DIAGNOSIS — R29898 Other symptoms and signs involving the musculoskeletal system: Secondary | ICD-10-CM

## 2020-08-21 DIAGNOSIS — M25611 Stiffness of right shoulder, not elsewhere classified: Secondary | ICD-10-CM

## 2020-08-21 DIAGNOSIS — M25511 Pain in right shoulder: Secondary | ICD-10-CM

## 2020-08-21 NOTE — Therapy (Signed)
[facility] [address] [place], [place], [address] Phone: [phone]   Fax:  [phone]  Occupational Therapy Treatment  Patient Details  Name: Martin Bruce MRN: [id number] Date of Birth: Apr 24, 1976 Referring Provider (OT): Dr. Darreld Mclean   Encounter Date: 08/21/2020   OT End of Session - 08/21/20 1810    Visit Number 3    Number of Visits 16    Date for OT Re-Evaluation 10/04/20   mini reassess on 4/1   Authorization Type [employer] Medicare    Progress Note Due on Visit 10    OT Start Time 1650    OT Stop Time 1730    OT Time Calculation (min) 40 min    Activity Tolerance Patient tolerated treatment well    Behavior During Therapy [facility] for tasks assessed/performed           Past Medical History:  Diagnosis Date  . Anxiety   . Asthma   . Gout     Past Surgical History:  Procedure Laterality Date  . CARPAL TUNNEL RELEASE    . CHOLECYSTECTOMY     denies    There were no vitals filed for this visit.   Subjective Assessment - 08/21/20 1655    Subjective  S: Going to [place] tomorrow and won't be back till Tuesday.    Currently in Pain? Yes    Pain Score 8     Pain Location Shoulder    Pain Orientation Right    Pain Descriptors / Indicators Aching;Sore    Pain Type Chronic pain    Pain Onset Today    Pain Frequency Constant    Aggravating Factors  woke up with this pain level. Maybe the weather.    Pain Relieving Factors unsure.    Effect of Pain on Daily Activities mod effect    Multiple Pain Sites No              [facility] OT Assessment - 08/21/20 1809      Assessment   Medical Diagnosis Right Shoulder Pain    Referring Provider (OT) Dr. Darreld Mclean      Precautions   Precautions None                    OT Treatments/Exercises (OP) - 08/21/20 1721      Exercises   Exercises Shoulder      Shoulder Exercises: Supine   Protraction PROM;5 reps    Horizontal ABduction PROM;5 reps     External Rotation PROM;5 reps    Internal Rotation PROM;5 reps    Flexion PROM;5 reps    ABduction PROM;5 reps      Shoulder Exercises: Standing   Extension Theraband;10 reps    Theraband Level (Shoulder Extension) Level 2 (Red)    Row Theraband;10 reps    Theraband Level (Shoulder Row) Level 2 (Red)    Retraction Theraband;10 reps    Theraband Level (Shoulder Retraction) Level 2 (Red)      Manual Therapy   Manual Therapy Myofascial release;Other (comment)    Manual therapy comments manual therapy completed seperately than all other interventions this date.    Myofascial Release myofascial release and manual stretching to decrease pain and restrictions in right shoulder, scapular, and upper arm region for improved mobility.    Other Manual Therapy Self Myofascial release completed with tennis ball against wall and door frame. patient focused on upper trapezius, anterior and medial portion of right upper arm.  OT Education - 08/21/20 1810    Education Details trigger point release with tennis ball, scapular strengthening with red band.    Person(s) Educated Patient    Methods Explanation;Demonstration;Verbal cues;Handout    Comprehension Verbalized understanding;Returned demonstration            OT Short Term Goals - 08/13/20 2107      OT SHORT TERM GOAL #1   Title Patient will be educated and independent with HEP for improved RUE mobility.    Time 4    Period Weeks    Status On-going    Target Date 09/06/20      OT SHORT TERM GOAL #2   Title Patient will decrease pain to 6/10 or better when cold air hits his shoulder.    Time 4    Period Weeks    Status On-going      OT SHORT TERM GOAL #3   Title Patient will decrease fascial restrictions from max to mod in his right shoulder region for decreased pain and improved mobilty.    Time 4    Period Weeks    Status On-going             OT Long Term Goals - 08/13/20 2107      OT LONG TERM  GOAL #1   Title [name] will use RUE as dominant with all desired B/IADLs and leisure tasks.    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    Time 8    Period Weeks    Status On-going      OT LONG TERM GOAL #3   Title Patient will improve his RUE shoulder A/ROM to WNL for improved ability to reach overhead.    Time 8    Period Weeks    Status On-going      OT LONG TERM GOAL #4   Title Patient will improve right shoulder strength to 5/5 for improved ability to lift bags of groceries and baking supplies.    Time 8    Period Weeks    Status On-going      OT LONG TERM GOAL #5   Title Patient will decrease pain in his right shoulder to 2/10 or better in his right shoulder during functional tasks and when cold air hits his shoulder.    Time 8    Period Weeks    Status On-going                 Plan - 08/21/20 1810    Clinical Impression Statement A: Patient with increased fascial restrictions located at the right upper arm, trapezius, and scapularis region. Manual techniques completed to address with good results. Education provided on completing self myofascial release with tennis ball. Scapular strengthening completed and provided for HEP. Required VC for form and technique. Difficulty with decreasing speed of movement when completing.    Body Structure / Function / Physical Skills ADL;Strength;Pain;UE functional use;ROM;IADL;Fascial restriction;Muscle spasms    Plan P: Follow up on HEP from last session. Attempt A/ROM supine and progress to A/ROM as able.    OT Home Exercise Plan initial eval:  shoulder stretches 3/16: red band scapular strengthening, trigger point release with tennis ball.    Consulted and Agree with Plan of Care Patient  Patient will benefit from skilled therapeutic intervention in order to improve the following  deficits and impairments:   Body Structure / Function / Physical Skills: ADL,Strength,Pain,UE functional use,ROM,IADL,Fascial restriction,Muscle spasms       Visit Diagnosis: Stiffness of right shoulder, not elsewhere classified  Other symptoms and signs involving the musculoskeletal system  Acute pain of right shoulder    Problem List Patient Active Problem List   Diagnosis Date Noted  . Encounter to establish care 06/19/2020  . Tear of right rotator cuff 06/19/2020  . Gastroesophageal reflux disease 06/19/2020  . Anxiety with depression 06/19/2020  . Chronic, continuous use of opioids 06/19/2020  . Gout 06/19/2020  . Vaccine refused by patient 06/19/2020  . Mild intermittent asthma without complication 06/19/2020  . Psychosis [facility]) 06/19/2020    Limmie Patricia, OTR/L,CBIS  [phone]  08/21/2020, 6:15 PM  [facility] [address] [place], [place], [address] Phone: [phone]   Fax:  [phone]  Name: Martin Bruce MRN: [id number] Date of Birth: 1975-07-24

## 2020-08-21 NOTE — Patient Instructions (Addendum)
Trigger point traps   Place therapy ball(s) (tune up balls, tennis ball, etc.) on the inside of the shoulder blade, on one side of your back. Ball(s) should be between the scapula and the spine. Bring arm across your body to protract the shoulder. Slowly roll back and forth on the soft tissue between the shoulder blade and spine, focusing on specific tight areas of your upper back and traps.    1) (Home) Extension: Isometric / Bilateral Arm Retraction - Sitting   Facing anchor, hold hands and elbow at shoulder height, with elbow bent.  Pull arms back to squeeze shoulder blades together. Repeat 10-15 times. 1-3 times/day.   2) (Clinic) Extension / Flexion (Assist)   Face anchor, pull arms back, keeping elbow straight, and squeze shoulder blades together. Repeat 10-15 times. 1-3 times/day.   Copyright  VHI. All rights reserved.   3) (Home) Retraction: Row - Bilateral (Anchor)   Facing anchor, arms reaching forward, pull hands toward stomach, keeping elbows bent and at your sides and pinching shoulder blades together. Repeat 10-15 times. 1-3 times/day.   Copyright  VHI. All rights reserved.

## 2020-08-27 ENCOUNTER — Encounter (HOSPITAL_COMMUNITY): Payer: Medicare Other

## 2020-08-29 ENCOUNTER — Ambulatory Visit (HOSPITAL_COMMUNITY): Payer: Medicare Other

## 2020-08-29 ENCOUNTER — Ambulatory Visit: Payer: Medicare Other | Admitting: Orthopaedic Surgery

## 2020-09-03 ENCOUNTER — Encounter (HOSPITAL_COMMUNITY): Payer: Self-pay

## 2020-09-03 ENCOUNTER — Ambulatory Visit: Payer: Medicare Other | Admitting: Orthopaedic Surgery

## 2020-09-03 ENCOUNTER — Other Ambulatory Visit: Payer: Self-pay

## 2020-09-03 ENCOUNTER — Ambulatory Visit (HOSPITAL_COMMUNITY): Payer: Medicare Other

## 2020-09-03 DIAGNOSIS — R29898 Other symptoms and signs involving the musculoskeletal system: Secondary | ICD-10-CM

## 2020-09-03 DIAGNOSIS — M25611 Stiffness of right shoulder, not elsewhere classified: Secondary | ICD-10-CM

## 2020-09-03 DIAGNOSIS — M25511 Pain in right shoulder: Secondary | ICD-10-CM

## 2020-09-03 NOTE — Therapy (Signed)
[facility] [address] [place], [place], [address] Phone: [phone]   Fax:  [phone]  Occupational Therapy Treatment  Patient Details  Name: Martin Bruce MRN: [id number] Date of Birth: 12/16/1975 Referring Provider (OT): Dr. Darreld Mclean   Encounter Date: 09/03/2020   OT End of Session - 09/03/20 1624    Visit Number 4    Number of Visits 16    Date for OT Re-Evaluation 10/04/20   mini reassess on 4/1   Authorization Type [employer] Medicare    Progress Note Due on Visit 10    OT Start Time 1432    OT Stop Time 1512    OT Time Calculation (min) 40 min    Activity Tolerance Patient limited by pain    Behavior During Therapy [facility] for tasks assessed/performed           Past Medical History:  Diagnosis Date  . Anxiety   . Asthma   . Gout     Past Surgical History:  Procedure Laterality Date  . CARPAL TUNNEL RELEASE    . CHOLECYSTECTOMY     denies    There were no vitals filed for this visit.   Subjective Assessment - 09/03/20 1451    Subjective  S: It is just bothering me today.    Currently in Pain? Yes    Pain Score 7     Pain Location Shoulder    Pain Orientation Right    Pain Descriptors / Indicators Aching;Sore    Pain Type Chronic pain    Pain Onset In the past 7 days    Pain Frequency Constant    Aggravating Factors  nothing caused the pain.    Pain Relieving Factors heat, TENS unit.              [facility] OT Assessment - 09/03/20 1623      Assessment   Medical Diagnosis Right Shoulder Pain      Precautions   Precautions None                    OT Treatments/Exercises (OP) - 09/03/20 1623      Exercises   Exercises Shoulder      Shoulder Exercises: Supine   External Rotation PROM;5 reps    Internal Rotation PROM;5 reps    Flexion PROM;5 reps      Modalities   Modalities Electrical Stimulation;Moist Heat      Moist Heat Therapy   Number Minutes Moist Heat 10 Minutes    Moist  Heat Location Shoulder;Cervical      Electrical Stimulation   Electrical Stimulation Location left shoulder and upper trapezius region    Electrical Stimulation Action interferential    Electrical Stimulation Parameters 11.5CV    Electrical Stimulation Goals Pain      Manual Therapy   Manual Therapy Myofascial release    Manual therapy comments manual therapy completed seperately than all other interventions this date.    Myofascial Release myofascial release and manual stretching to decrease pain and restrictions in right shoulder, scapular, and upper arm region for improved mobility.                  OT Education - 09/03/20 1624    Education Details Provided written information on how to purchase TENS unit online.    Person(s) Educated Patient    Methods Explanation    Comprehension Verbalized understanding  OT Short Term Goals - 08/13/20 2107      OT SHORT TERM GOAL #1   Title Patient will be educated and independent with HEP for improved RUE mobility.    Time 4    Period Weeks    Status On-going    Target Date 09/06/20      OT SHORT TERM GOAL #2   Title Patient will decrease pain to 6/10 or better when cold air hits his shoulder.    Time 4    Period Weeks    Status On-going      OT SHORT TERM GOAL #3   Title Patient will decrease fascial restrictions from max to mod in his right shoulder region for decreased pain and improved mobilty.    Time 4    Period Weeks    Status On-going             OT Long Term Goals - 08/13/20 2107      OT LONG TERM GOAL #1   Title [name] will use RUE as dominant with all desired B/IADLs and leisure tasks.    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    Time 8    Period Weeks    Status On-going      OT LONG TERM GOAL #3   Title Patient will improve his RUE shoulder A/ROM to  WNL for improved ability to reach overhead.    Time 8    Period Weeks    Status On-going      OT LONG TERM GOAL #4   Title Patient will improve right shoulder strength to 5/5 for improved ability to lift bags of groceries and baking supplies.    Time 8    Period Weeks    Status On-going      OT LONG TERM GOAL #5   Title Patient will decrease pain in his right shoulder to 2/10 or better in his right shoulder during functional tasks and when cold air hits his shoulder.    Time 8    Period Weeks    Status On-going                 Plan - 09/03/20 1625    Clinical Impression Statement A: Pt arrived to session with report of increased pain level with no known cause. Started to experience higher level of pain over the weekend. Completed myofascial release to right upper arm, shoulder, and upper trapezius region. Limited tolerance to passive stretching with pain level increasing. Utilized ES and moist heat at end of session to focus further on pain management. Patient arrived with pain level of 7/10 and reports a pain level of 2/10 at end of session. provided information on TENS unit for purchase online.    Body Structure / Function / Physical Skills ADL;Strength;Pain;UE functional use;ROM;IADL;Fascial restriction;Muscle spasms    Plan P: Mini reassessment. Attempt A/ROM supine and progross to standing if pain level allows. Follow up on pain level from last session.    Consulted and Agree with Plan of Care Patient           Patient will benefit from skilled therapeutic intervention in order to improve the following deficits and impairments:   Body Structure / Function / Physical Skills: ADL,Strength,Pain,UE functional use,ROM,IADL,Fascial restriction,Muscle spasms  Visit Diagnosis: Stiffness of right shoulder, not elsewhere classified  Other symptoms and signs involving the musculoskeletal system  Acute pain of right shoulder    Problem List Patient Active Problem  List   Diagnosis Date Noted  . Encounter to establish care 06/19/2020  . Tear of right rotator cuff 06/19/2020  . Gastroesophageal reflux disease 06/19/2020  . Anxiety with depression 06/19/2020  . Chronic, continuous use of opioids 06/19/2020  . Gout 06/19/2020  . Vaccine refused by patient 06/19/2020  . Mild intermittent asthma without complication 06/19/2020  . Psychosis [facility]) 06/19/2020    Limmie Patricia, OTR/L,CBIS  [phone]  09/03/2020, 4:28 PM  [facility] [address] [place], [place], [address] Phone: [phone]   Fax:  [phone]  Name: AIDIAN SALOMON MRN: [id number] Date of Birth: 1975-10-24

## 2020-09-05 ENCOUNTER — Ambulatory Visit (HOSPITAL_COMMUNITY): Payer: Medicare Other | Admitting: Occupational Therapy

## 2020-09-05 ENCOUNTER — Other Ambulatory Visit: Payer: Self-pay

## 2020-09-05 DIAGNOSIS — R29898 Other symptoms and signs involving the musculoskeletal system: Secondary | ICD-10-CM

## 2020-09-05 DIAGNOSIS — M25511 Pain in right shoulder: Secondary | ICD-10-CM

## 2020-09-05 DIAGNOSIS — M25611 Stiffness of right shoulder, not elsewhere classified: Secondary | ICD-10-CM | POA: Diagnosis not present

## 2020-09-05 NOTE — Therapy (Signed)
[place], [place], [address] Phone: [phone]   Fax:  [phone]  Occupational Therapy Treatment  Patient Details  Name: Martin Bruce MRN: [id number] Date of Birth: [birth year] Referring Provider (OT): Dr. Sanjuana Kava   Encounter Date: 09/05/2020   OT End of Session - 09/05/20 1512    Visit Number 5    Number of Visits 16    Date for OT Re-Evaluation 10/04/20    Authorization Type [employer] Medicare    Progress Note Due on Visit 10    OT Start Time 1441   pt arrived late   OT Stop Time 1510    OT Time Calculation (min) 29 min    Activity Tolerance Patient limited by pain    Behavior During Therapy [facility] for tasks assessed/performed           Past Medical History:  Diagnosis Date  . Anxiety   . Asthma   . Gout     Past Surgical History:  Procedure Laterality Date  . CARPAL TUNNEL RELEASE    . CHOLECYSTECTOMY     denies    There were no vitals filed for this visit.   Subjective Assessment - 09/05/20 1442    Subjective  S: It's been killing me.    Currently in Pain? Yes    Pain Score 8     Pain Location Shoulder    Pain Orientation Right    Pain Descriptors / Indicators Sore;Aching    Pain Type Chronic pain    Pain Radiating Towards upper arm, can increase to 10/10    Pain Onset In the past 7 days    Pain Frequency Constant    Aggravating Factors  nothing    Pain Relieving Factors heat, TENS unit    Effect of Pain on Daily Activities mod effect on ADLs    Multiple Pain Sites No              [facility] OT Assessment - 09/05/20 1443      Assessment   Medical Diagnosis Right Shoulder Pain      Precautions   Precautions None      Observation/Other Assessments   Focus on Therapeutic Outcomes (FOTO)  54/100   65/100 previous     AROM   Overall AROM Comments assessed in seated, external and internal rotation with shoulder abducted to 90    AROM Assessment Site Shoulder    Right/Left  Shoulder Right    Right Shoulder Flexion 170 Degrees   same as previous   Right Shoulder ABduction 150 Degrees   same as previous   Right Shoulder Internal Rotation 50 Degrees   same as previous   Right Shoulder External Rotation 80 Degrees   75 previous     Strength   Overall Strength Comments assessed in seated, external rotation and internal rotation with shoulders adducted    Strength Assessment Site Shoulder    Right/Left Shoulder Right    Right Shoulder Flexion 4+/5   4/5 previous   Right Shoulder ABduction 4/5   same as previous   Right Shoulder Internal Rotation 5/5   4/5 previous   Right Shoulder External Rotation 4+/5   4/5 previous                   OT Treatments/Exercises (OP) - 09/05/20 1447      Exercises   Exercises Shoulder      Shoulder Exercises: Supine   Protraction AROM;10  reps    External Rotation AROM;10 reps    Internal Rotation AROM;10 reps    Flexion AROM;10 reps      Modalities   Modalities Electrical Stimulation      Electrical Stimulation   Electrical Stimulation Location left shoulder and upper trapezius region    Electrical Stimulation Action interferential    Electrical Stimulation Parameters 12.0CV    Electrical Stimulation Goals Pain                    OT Short Term Goals - 08/13/20 2107      OT SHORT TERM GOAL #1   Title Patient will be educated and independent with HEP for improved RUE mobility.    Time 4    Period Weeks    Status On-going    Target Date 09/06/20      OT SHORT TERM GOAL #2   Title Patient will decrease pain to 6/10 or better when cold air hits his shoulder.    Time 4    Period Weeks    Status On-going      OT SHORT TERM GOAL #3   Title Patient will decrease fascial restrictions from max to mod in his right shoulder region for decreased pain and improved mobilty.    Time 4    Period Weeks    Status On-going             OT Long Term Goals - 08/13/20 2107      OT LONG TERM GOAL #1    Title [name] will use RUE as dominant with all desired B/IADLs and leisure tasks.    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    Time 8    Period Weeks    Status On-going      OT LONG TERM GOAL #3   Title Patient will improve his RUE shoulder A/ROM to WNL for improved ability to reach overhead.    Time 8    Period Weeks    Status On-going      OT LONG TERM GOAL #4   Title Patient will improve right shoulder strength to 5/5 for improved ability to lift bags of groceries and baking supplies.    Time 8    Period Weeks    Status On-going      OT LONG TERM GOAL #5   Title Patient will decrease pain in his right shoulder to 2/10 or better in his right shoulder during functional tasks and when cold air hits his shoulder.    Time 8    Period Weeks    Status On-going                 Plan - 09/05/20 1502    Clinical Impression Statement A: Pt arrived late for session. Mini-reassessment completed this session, pt ROM is [facility], has improved or maintained strength. Pt reports increased pain with use and with exercises, reports he wants to chop his arms off. Pt describing pain as deep at the glenohumeral joint. No goals met at this time. Session focusing on pain control, pt reporting ES assisted with pain last session therefore trialing again today. Pt reports exercises are aggravating his pain (is only completing shoulder stretches), discussed resting the shoulder over the weekend to see  if pain changes. Pt reporting pain as 6/10 after ES compared to 8/10 on arrival.    Body Structure / Function / Physical Skills ADL;Strength;Pain;UE functional use;ROM;IADL;Fascial restriction;Muscle spasms    Plan P: Continue with A/ROM supine and progross to standing if pain level allows. Follow up on pain level after resting shoulder over the weekend.    OT Home Exercise  Plan initial eval:  shoulder stretches 3/16: red band scapular strengthening, trigger point release with tennis ball.    Consulted and Agree with Plan of Care Patient           Patient will benefit from skilled therapeutic intervention in order to improve the following deficits and impairments:   Body Structure / Function / Physical Skills: ADL,Strength,Pain,UE functional use,ROM,IADL,Fascial restriction,Muscle spasms       Visit Diagnosis: Stiffness of right shoulder, not elsewhere classified  Other symptoms and signs involving the musculoskeletal system  Acute pain of right shoulder    Problem List Patient Active Problem List   Diagnosis Date Noted  . Encounter to establish care 06/19/2020  . Tear of right rotator cuff 06/19/2020  . Gastroesophageal reflux disease 06/19/2020  . Anxiety with depression 06/19/2020  . Chronic, continuous use of opioids 06/19/2020  . Gout 06/19/2020  . Vaccine refused by patient 06/19/2020  . Mild intermittent asthma without complication 43/73/5789  . Psychosis ([place]) 06/19/2020   Guadelupe Sabin, OTR/L  [phone] 09/05/2020, 3:13 PM  [facility] [address] [place], [place], [address] Phone: [phone]   Fax:  [phone]  Name: Martin Bruce MRN: [id number] Date of Birth: 11/13/1975

## 2020-09-10 ENCOUNTER — Telehealth (HOSPITAL_COMMUNITY): Payer: Self-pay

## 2020-09-10 ENCOUNTER — Other Ambulatory Visit: Payer: Self-pay

## 2020-09-10 ENCOUNTER — Ambulatory Visit (HOSPITAL_COMMUNITY): Payer: Medicare Other

## 2020-09-10 ENCOUNTER — Ambulatory Visit (INDEPENDENT_AMBULATORY_CARE_PROVIDER_SITE_OTHER): Payer: Medicare Other | Admitting: Orthopaedic Surgery

## 2020-09-10 ENCOUNTER — Encounter: Payer: Self-pay | Admitting: Orthopaedic Surgery

## 2020-09-10 VITALS — BP 147/91 | HR 60 | Ht 73.0 in | Wt 384.0 lb

## 2020-09-10 DIAGNOSIS — M25511 Pain in right shoulder: Secondary | ICD-10-CM

## 2020-09-10 DIAGNOSIS — J45909 Unspecified asthma, uncomplicated: Secondary | ICD-10-CM | POA: Insufficient documentation

## 2020-09-10 DIAGNOSIS — G8929 Other chronic pain: Secondary | ICD-10-CM

## 2020-09-10 DIAGNOSIS — Z6841 Body Mass Index (BMI) 40.0 and over, adult: Secondary | ICD-10-CM

## 2020-09-10 MED ORDER — HYDROCODONE-ACETAMINOPHEN 5-325 MG PO TABS: ORAL_TABLET | ORAL | 0 refills | Status: DC

## 2020-09-10 NOTE — Telephone Encounter (Signed)
He just got out of his other MD apptment and will not make it here on time to be seen. Please cx.

## 2020-09-10 NOTE — Progress Notes (Signed)
Patient Martin Bruce Martin Bruce, male DOB:04-24-1976, 45 y.o. HBZ:[id number]  Chief Complaint  Patient presents with  . Shoulder Pain    Right shoulder pain hurts all the time     HPI  Martin Bruce is a 45 y.o. male who has chronic right shoulder pain.  He just got back from [place].  He had a good time.  He has continued pain in the right shoulder.  He did his exercises while gone and will resume PT today.  He has no new trauma.  MRI did not show a tear.   Body mass index is 50.66 kg/m.  The patient meets the AMA guidelines for Morbid (severe) obesity with a BMI > 40.0 and I have recommended weight loss.   ROS  Review of Systems  Constitutional: Positive for activity change.  Respiratory: Positive for shortness of breath.   Musculoskeletal: Positive for arthralgias and myalgias.  Psychiatric/Behavioral: The patient is nervous/anxious.   All other systems reviewed and are negative.   All other systems reviewed and are negative.  The following is a summary of the past history medically, past history surgically, known current medicines, social history and family history.  This information is gathered electronically by the computer from prior information and documentation.  I review this each visit and have found including this information at this point in the chart is beneficial and informative.    Past Medical History:  Diagnosis Date  . Anxiety   . Asthma   . Gout     Past Surgical History:  Procedure Laterality Date  . CARPAL TUNNEL RELEASE    . CHOLECYSTECTOMY     denies    Family History  Family history unknown: Yes    Social History Social History   Tobacco Use  . Smoking status: Passive Smoke Exposure - Never Smoker  . Smokeless tobacco: Never Used  Vaping Use  . Vaping Use: Never used  Substance Use Topics  . Alcohol use: Yes    Comment: occ  . Drug use: Yes    Types: Marijuana    Comment: medical marijuana    Allergies  Allergen Reactions  .  Penicillins Rash    Current Outpatient Medications  Medication Sig Dispense Refill  . albuterol (PROVENTIL) (2.5 MG/3ML) 0.083% nebulizer solution Take 2.5 mg by nebulization every 6 (six) hours as needed for wheezing or shortness of breath.    Marland Kitchen albuterol (VENTOLIN HFA) 108 (90 Base) MCG/ACT inhaler Inhale 2 puffs into the lungs every 6 (six) hours as needed for wheezing or shortness of breath. 18 g 5  . alprazolam (XANAX) 2 MG tablet Take 2 mg by mouth 3 (three) times daily.    . cyclobenzaprine (FLEXERIL) 10 MG tablet Take by mouth.    Marland Kitchen FLUoxetine (PROZAC) 20 MG capsule Take 20 mg by mouth daily.    . fluticasone (FLOVENT HFA) 110 MCG/ACT inhaler Inhale into the lungs.    . naproxen (NAPROSYN) 500 MG tablet Take 1 tablet (500 mg total) by mouth 2 (two) times daily with a meal. 60 tablet 5  . ondansetron (ZOFRAN ODT) 4 MG disintegrating tablet Take 1 tablet (4 mg total) by mouth every 8 (eight) hours as needed for nausea or vomiting. 4 tablet 0  . pantoprazole (PROTONIX) 20 MG tablet Take 1 tablet (20 mg total) by mouth daily. 30 tablet 0  . sucralfate (CARAFATE) 1 g tablet Take 1 tablet (1 g total) by mouth 2 (two) times daily. 20 tablet 0  . triamcinolone (KENALOG)  0.1 % Apply topically.    . ziprasidone (GEODON) 80 MG capsule Take 80 mg by mouth at bedtime.    Marland Kitchen HYDROcodone-acetaminophen (NORCO/VICODIN) 5-325 MG tablet One tablet every four hours for pain. 30 tablet 0   No current facility-administered medications for this visit.     Physical Exam  Blood pressure (!) 147/91, pulse 60, height 6\' 1"  (1.854 m), weight (!) 384 lb (174.2 kg).  Constitutional: overall normal hygiene, normal nutrition, well developed, normal grooming, normal body habitus. Assistive device:none  Musculoskeletal: gait and station Limp none, muscle tone and strength are normal, no tremors or atrophy is present.  .  Neurological: coordination overall normal.  Deep tendon reflex/nerve stretch intact.   Sensation normal.  Cranial nerves II-XII intact.   Skin:   Normal overall no scars, lesions, ulcers or rashes. No psoriasis.  Psychiatric: Alert and oriented x 3.  Recent memory intact, remote memory unclear.  Normal mood and affect. Well groomed.  Good eye contact.  Cardiovascular: overall no swelling, no varicosities, no edema bilaterally, normal temperatures of the legs and arms, no clubbing, cyanosis and good capillary refill.  Lymphatic: palpation is normal.  Right shoulder has pain in the extremes.  NV intact.  All other systems reviewed and are negative   The patient has been educated about the nature of the problem(s) and counseled on treatment options.  The patient appeared to understand what I have discussed and is in agreement with it.  Encounter Diagnoses  Name Primary?  . Chronic right shoulder pain Yes  . Body mass index 45.0-49.9, adult (HCC)   . Morbid obesity (HCC)     PLAN Call if any problems.  Precautions discussed.  Continue current medications.   Return to clinic 3 weeks   Resume OT.  Electronically Signed 11-01-1983, MD 4/5/20229:25 AM

## 2020-09-12 ENCOUNTER — Ambulatory Visit (HOSPITAL_COMMUNITY): Payer: Medicare Other | Attending: Orthopaedic Surgery

## 2020-09-12 DIAGNOSIS — M25511 Pain in right shoulder: Secondary | ICD-10-CM | POA: Insufficient documentation

## 2020-09-12 DIAGNOSIS — R29898 Other symptoms and signs involving the musculoskeletal system: Secondary | ICD-10-CM | POA: Insufficient documentation

## 2020-09-12 DIAGNOSIS — M25611 Stiffness of right shoulder, not elsewhere classified: Secondary | ICD-10-CM | POA: Insufficient documentation

## 2020-09-17 ENCOUNTER — Other Ambulatory Visit: Payer: Self-pay

## 2020-09-17 ENCOUNTER — Ambulatory Visit (HOSPITAL_COMMUNITY): Payer: Medicare Other

## 2020-09-17 ENCOUNTER — Encounter (HOSPITAL_COMMUNITY): Payer: Self-pay

## 2020-09-17 DIAGNOSIS — M25611 Stiffness of right shoulder, not elsewhere classified: Secondary | ICD-10-CM

## 2020-09-17 DIAGNOSIS — M25511 Pain in right shoulder: Secondary | ICD-10-CM

## 2020-09-17 DIAGNOSIS — R29898 Other symptoms and signs involving the musculoskeletal system: Secondary | ICD-10-CM | POA: Diagnosis present

## 2020-09-17 NOTE — Therapy (Signed)
[facility] [address] [place], [place], [address] Phone: [phone]   Fax:  [phone]  Occupational Therapy Treatment  Patient Details  Name: Martin Bruce MRN: [id number] Date of Birth: [birth year] Referring Provider (OT): Dr. Darreld Mclean   Encounter Date: 09/17/2020   OT End of Session - 09/17/20 1551    Visit Number 6    Number of Visits 16    Date for OT Re-Evaluation 10/04/20    Authorization Type [employer] Medicare    Progress Note Due on Visit 10    OT Start Time 1446   Pt arrived late.   OT Stop Time 1516    OT Time Calculation (min) 30 min    Activity Tolerance Patient limited by pain    Behavior During Therapy [facility] for tasks assessed/performed           Past Medical History:  Diagnosis Date  . Anxiety   . Asthma   . Gout     Past Surgical History:  Procedure Laterality Date  . CARPAL TUNNEL RELEASE    . CHOLECYSTECTOMY     denies    There were no vitals filed for this visit.   Subjective Assessment - 09/17/20 1447    Subjective  S: It hurts all the time.    Currently in Pain? Yes    Pain Score 7     Pain Location Shoulder    Pain Orientation Right    Pain Descriptors / Indicators Constant;Aching    Pain Type Chronic pain    Pain Onset 1 to 4 weeks ago    Pain Frequency Constant    Aggravating Factors  moving the arm    Pain Relieving Factors takes pain medicationa and muscle relaxer although doesn't. Heat provides temporary relief.    Effect of Pain on Daily Activities severe effect and will stop activity. If activity is required then he will push through the pain.    Multiple Pain Sites No              [facility] OT Assessment - 09/17/20 1547      Assessment   Medical Diagnosis Right Shoulder Pain      Precautions   Precautions None                    OT Treatments/Exercises (OP) - 09/17/20 1548      Exercises   Exercises Shoulder      Manual Therapy   Manual Therapy  Myofascial release    Manual therapy comments manual therapy completed seperately than all other interventions this date.    Myofascial Release myofascial release completed to decrease pain and restrictions in right shoulder, scapular,upper arm and right lateral cervical and occipital region to decrease fascial restrictions and muscle tension..                  OT Education - 09/17/20 1549    Education Details education provided on using tennis ball to posterior neck reigon to decrease muscle tightness/tension and fascial restrictions.    Person(s) Educated Patient    Methods Explanation    Comprehension Verbalized understanding            OT Short Term Goals - 08/13/20 2107      OT SHORT TERM GOAL #1   Title Patient will be educated and independent with HEP for improved RUE mobility.    Time 4    Period Weeks    Status On-going  Target Date 09/06/20      OT SHORT TERM GOAL #2   Title Patient will decrease pain to 6/10 or better when cold air hits his shoulder.    Time 4    Period Weeks    Status On-going      OT SHORT TERM GOAL #3   Title Patient will decrease fascial restrictions from max to mod in his right shoulder region for decreased pain and improved mobilty.    Time 4    Period Weeks    Status On-going             OT Long Term Goals - 08/13/20 2107      OT LONG TERM GOAL #1   Title [name] will use RUE as dominant with all desired B/IADLs and leisure tasks.    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    Time 8    Period Weeks    Status On-going      OT LONG TERM GOAL #3   Title Patient will improve his RUE shoulder A/ROM to WNL for improved ability to reach overhead.    Time 8    Period Weeks    Status On-going      OT LONG TERM GOAL #4   Title Patient will improve right shoulder strength to 5/5 for improved  ability to lift bags of groceries and baking supplies.    Time 8    Period Weeks    Status On-going      OT LONG TERM GOAL #5   Title Patient will decrease pain in his right shoulder to 2/10 or better in his right shoulder during functional tasks and when cold air hits his shoulder.    Time 8    Period Weeks    Status On-going                 Plan - 09/17/20 1621    Clinical Impression Statement A: Due to high level of pain, session focused on pain management while focusing on upper trapezius, pectoralis region, scapularis, and posterior and right lateral aspect of neck. Performed occipital release to decrease fascial restrictions and muscle tension. MD follow up appointment was completed last week with nothing new to report. He is to follow on with Dr. Hilda Lias on 10/01/20. At end of session, patient reports a decrease in tension with no pain number. Encouraged him to continue to use heat and apply to neck/shoulder region.    Body Structure / Function / Physical Skills ADL;Strength;Pain;UE functional use;ROM;IADL;Fascial restriction;Muscle spasms    Plan P: D/C passive stretching. If pain level is still prominant, continue to focus on myofascial release to right shoulder and cervical area. Tennis ball for myofascial release to posterior neck.    Consulted and Agree with Plan of Care Patient           Patient will benefit from skilled therapeutic intervention in order to improve the following deficits and impairments:   Body Structure / Function / Physical Skills: ADL,Strength,Pain,UE functional use,ROM,IADL,Fascial restriction,Muscle spasms       Visit Diagnosis: Other symptoms and signs involving the musculoskeletal system  Stiffness of right shoulder, not elsewhere classified  Acute pain of right shoulder    Problem List Patient Active Problem List   Diagnosis  Date Noted  . Asthma 09/10/2020  . Encounter to establish care 06/19/2020  . Tear of right rotator cuff  06/19/2020  . Gastroesophageal reflux disease 06/19/2020  . Anxiety with depression 06/19/2020  . Chronic, continuous use of opioids 06/19/2020  . Gout 06/19/2020  . Vaccine refused by patient 06/19/2020  . Mild intermittent asthma without complication 06/19/2020  . Psychosis (HCC) 06/19/2020  . Chronic pain of both shoulders 06/12/2016  . Moderate persistent asthma with exacerbation 06/12/2016  . Carpal tunnel syndrome 05/09/2013    Limmie Patricia, OTR/L,CBIS  [phone]  09/17/2020, 4:27 PM  [facility] [address] [place], [place], [address] Phone: [phone]   Fax:  [phone]  Name: Martin Bruce MRN: [id number] Date of Birth: Mar 31, 1976

## 2020-09-17 NOTE — Patient Instructions (Signed)
Lateral Posterior Neck Stretch  Place hand out on bed or chair. Look at arm armpit and place opposite hand on head and apply slight overpressure. Hold for 20-30 seconds. Complete 2 times. Do not be over forceful when stretching.

## 2020-09-19 ENCOUNTER — Ambulatory Visit (HOSPITAL_COMMUNITY): Payer: Medicare Other

## 2020-09-24 ENCOUNTER — Other Ambulatory Visit: Payer: Self-pay

## 2020-09-24 ENCOUNTER — Ambulatory Visit (HOSPITAL_COMMUNITY): Payer: Medicare Other

## 2020-09-24 ENCOUNTER — Encounter (HOSPITAL_COMMUNITY): Payer: Self-pay

## 2020-09-24 DIAGNOSIS — R29898 Other symptoms and signs involving the musculoskeletal system: Secondary | ICD-10-CM

## 2020-09-24 DIAGNOSIS — M25511 Pain in right shoulder: Secondary | ICD-10-CM

## 2020-09-24 DIAGNOSIS — M25611 Stiffness of right shoulder, not elsewhere classified: Secondary | ICD-10-CM

## 2020-09-24 NOTE — Therapy (Signed)
Martin Bruce, [place], [address] Phone: [phone]   Fax:  [phone]  Occupational Therapy Treatment Reassessment/discharge summary Patient Details  Name: Martin Bruce MRN: [id number] Date of Birth: 02/05/1976 Referring Provider (OT): Dr. Sanjuana Kava   Encounter Date: 09/24/2020   OT End of Session - 09/24/20 1459    Visit Number 7    Number of Visits 16    Authorization Type [employer] Medicare    Progress Note Due on Visit 10    OT Start Time 7510   pt arrived late. reassessment/discharge summary   OT Stop Time 1457    OT Time Calculation (min) 24 min    Activity Tolerance Other (comment)   Treatment limited by indifference.   Behavior During Therapy Flat affect           Past Medical History:  Diagnosis Date  . Anxiety   . Asthma   . Gout     Past Surgical History:  Procedure Laterality Date  . CARPAL TUNNEL RELEASE    . CHOLECYSTECTOMY     denies    There were no vitals filed for this visit.   Subjective Assessment - 09/24/20 1443    Subjective  S: I really don't use this arm.    Currently in Pain? No/denies              [facility] OT Assessment - 09/24/20 1444      Assessment   Medical Diagnosis Right Shoulder Pain      Precautions   Precautions None      Observation/Other Assessments   Focus on Therapeutic Outcomes (FOTO)  53/100   previous: 54/100     ROM / Strength   AROM / PROM / Strength AROM;Strength      AROM   Overall AROM Comments assessed in seated, external and internal rotation with shoulder abducted to 90    AROM Assessment Site Shoulder    Right/Left Shoulder Right    Right Shoulder Flexion 130 Degrees   previous: 170   Right Shoulder ABduction 109 Degrees   previous: 150   Right Shoulder Internal Rotation 65 Degrees   previous: 50   Right Shoulder External Rotation 58 Degrees   previous: 80     Strength   Overall Strength Comments assessed in seated, external  rotation and internal rotation with shoulders adducted    Strength Assessment Site Shoulder    Right/Left Shoulder Right    Right Shoulder Flexion 4+/5   previous: same   Right Shoulder ABduction 5/5   previous: 4/5   Right Shoulder Internal Rotation 5/5   previous: same   Right Shoulder External Rotation 4+/5   previous: same                             OT Short Term Goals - 09/24/20 1635      OT SHORT TERM GOAL #1   Title Patient will be educated and independent with HEP for improved RUE mobility.    Time 4    Period Weeks    Status Not Met    Target Date 09/06/20      OT SHORT TERM GOAL #2   Title Patient will decrease pain to 6/10 or better when cold air hits his shoulder.    Time 4    Period Weeks    Status Not Met      OT SHORT TERM GOAL #  3   Title Patient will decrease fascial restrictions from max to mod in his right shoulder region for decreased pain and improved mobilty.    Time 4    Period Weeks    Status Not Met             OT Long Term Goals - 09/24/20 1635      OT LONG TERM GOAL #1   Title [name] will use RUE as dominant with all desired B/IADLs and leisure tasks.    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    Time 8    Period Weeks    Status Not Met      OT LONG TERM GOAL #3   Title Patient will improve his RUE shoulder A/ROM to WNL for improved ability to reach overhead.    Time 8    Period Weeks    Status Not Met      OT LONG TERM GOAL #4   Title Patient will improve right shoulder strength to 5/5 for improved ability to lift bags of groceries and baking supplies.    Time 8    Period Weeks    Status Not Met      OT LONG TERM GOAL #5   Title Patient will decrease pain in his right shoulder to 2/10 or better in his right shoulder during functional tasks and when cold air hits his shoulder.     Time 8    Period Weeks    Status Not Met                 Plan - 09/24/20 1635    Clinical Impression Statement A: Completed reassessment this date and discussed overall progress of shoulder since start of therapy. Discussed attendance policy and the importance of coming to therapy consistanly while completing HEP. Pt start with OT on 08/09/20 and has completed 7 visits in 6 weeks with 3 cancellations and 3 no shows. Patient's attendance has not been consistant. He reports difficulty with remembering appointments even when provided a reminder call/text/e-mail the day before. Attempted to help problem solve possible solutions to help with remembering appointments. All suggestions provided by OT were not accepted and patient was unable to provide any alternative to resolve difficulty. Initially, patient was making progress with his right shoulder pain although upon returning from trip to [place], he voiced increased pain and demonstrated severe difficulty completing exercises during session. Patient is able to achieve full P/ROM. Today, seated measurements have significantly decreased with decreased carry over of form and technique when provided.  After reviewing pt's decreased ROM, remaining elevated pain level, and difficulty with keeping a consistant attendance with therapy I recommended that patient be discharged from OT services. HEP was reviewed. Encouraged patient to complete shoulder stretches without increasing pain. Complete scapular strengthening if there is no pain. Continue to use right UE as much as possible to help maintain the limited ROM that is available. No therapy goals have been met.    Body Structure / Function / Physical Skills ADL;Strength;Pain;UE functional use;ROM;IADL;Fascial restriction;Muscle spasms    Plan P: D/C skilled OT services at this time. Complete only HEP exercises that do not cause increased pain. Follow up with Dr.Keeling.    Consulted and Agree with Plan of  Care Patient           Patient will benefit from skilled therapeutic intervention in order to improve the following deficits and impairments:   Body Structure / Function / Physical Skills: ADL,Strength,Pain,UE functional use,ROM,IADL,Fascial restriction,Muscle spasms       Visit Diagnosis: Acute pain of right shoulder  Stiffness of right shoulder, not elsewhere classified  Other symptoms and signs involving the musculoskeletal system    Problem List Patient Active Problem List   Diagnosis Date Noted  . Asthma 09/10/2020  . Encounter to establish care 06/19/2020  . Tear of right rotator cuff 06/19/2020  . Gastroesophageal reflux disease 06/19/2020  . Anxiety with depression 06/19/2020  . Chronic, continuous use of opioids 06/19/2020  . Gout 06/19/2020  . Vaccine refused by patient 06/19/2020  . Mild intermittent asthma without complication 37/95/5831  . Psychosis ([place]) 06/19/2020  . Chronic pain of both shoulders 06/12/2016  . Moderate persistent asthma with exacerbation 06/12/2016  . Carpal tunnel syndrome 05/09/2013     OCCUPATIONAL THERAPY DISCHARGE SUMMARY  Visits from Start of Care: 7  Current functional level related to goals / functional outcomes: See above   Remaining deficits: All deficits remain from initial evaluation   Education / Equipment: See above Plan: Patient agrees to discharge.  Patient goals were not met. Patient is being discharged due to lack of progress.  ?????       Ailene Ravel, OTR/L,CBIS  [phone]  09/24/2020, 5:25 PM  [facility] [address] [place], [place], [address] Phone: [phone]   Fax:  [phone]  Name: Martin Bruce MRN: [id number] Date of Birth: 06-Nov-1975

## 2020-09-26 ENCOUNTER — Ambulatory Visit (HOSPITAL_COMMUNITY): Payer: Medicare Other

## 2020-10-01 ENCOUNTER — Other Ambulatory Visit: Payer: Self-pay

## 2020-10-01 ENCOUNTER — Ambulatory Visit (INDEPENDENT_AMBULATORY_CARE_PROVIDER_SITE_OTHER): Payer: Medicare Other | Admitting: Orthopaedic Surgery

## 2020-10-01 ENCOUNTER — Encounter: Payer: Self-pay | Admitting: Orthopaedic Surgery

## 2020-10-01 VITALS — Ht 73.0 in

## 2020-10-01 DIAGNOSIS — G8929 Other chronic pain: Secondary | ICD-10-CM | POA: Diagnosis not present

## 2020-10-01 DIAGNOSIS — M25511 Pain in right shoulder: Secondary | ICD-10-CM

## 2020-10-01 DIAGNOSIS — Z6841 Body Mass Index (BMI) 40.0 and over, adult: Secondary | ICD-10-CM

## 2020-10-01 NOTE — Progress Notes (Signed)
PROCEDURE NOTE:  The patient request injection, verbal consent was obtained.  The right shoulder was prepped appropriately after time out was performed.   Sterile technique was observed and injection of 1 cc of Celestone 6 mg with several cc's of plain xylocaine. Anesthesia was provided by ethyl chloride and a 20-gauge needle was used to inject the shoulder area. A posterior approach was used.  The injection was tolerated well.  A band aid dressing was applied.  The patient was advised to apply ice later today and tomorrow to the injection sight as needed.  Stop PT/OT.  Return in one month,.  He is going to [place] for vacation for his birthday next week.  Call if any problem.  Precautions discussed.   Electronically Signed Darreld Mclean, MD 4/26/20229:43 AM

## 2020-10-02 ENCOUNTER — Telehealth: Payer: Self-pay | Admitting: Orthopaedic Surgery

## 2020-10-02 MED ORDER — HYDROCODONE-ACETAMINOPHEN 5-325 MG PO TABS: ORAL_TABLET | ORAL | 0 refills | Status: DC

## 2020-10-02 NOTE — Telephone Encounter (Signed)
Patient was seen in the office yesterday and states his medicine was never called into his pharmacy   HYDROcodone-acetaminophen (NORCO/VICODIN) 5-325 MG tablet   Pharmacy:  [facility] on [address]

## 2020-10-19 ENCOUNTER — Emergency Department (HOSPITAL_COMMUNITY)
Admission: EM | Admit: 2020-10-19 | Discharge: 2020-10-19 | Disposition: A | Discharge status: Home / Self Care | Payer: Medicare Other | Attending: Emergency Medicine | Admitting: Emergency Medicine

## 2020-10-19 ENCOUNTER — Encounter (HOSPITAL_COMMUNITY): Payer: Self-pay | Admitting: Emergency Medicine

## 2020-10-19 ENCOUNTER — Other Ambulatory Visit: Payer: Self-pay

## 2020-10-19 ENCOUNTER — Emergency Department (HOSPITAL_COMMUNITY): Payer: Medicare Other

## 2020-10-19 DIAGNOSIS — M1000 Idiopathic gout, unspecified site: Secondary | ICD-10-CM

## 2020-10-19 DIAGNOSIS — Z8739 Personal history of other diseases of the musculoskeletal system and connective tissue: Secondary | ICD-10-CM | POA: Diagnosis not present

## 2020-10-19 DIAGNOSIS — M79674 Pain in right toe(s): Secondary | ICD-10-CM | POA: Diagnosis not present

## 2020-10-19 DIAGNOSIS — Z5321 Procedure and treatment not carried out due to patient leaving prior to being seen by health care provider: Secondary | ICD-10-CM | POA: Insufficient documentation

## 2020-10-19 LAB — CBC WITH DIFFERENTIAL/PLATELET
Abs Immature Granulocytes: 0.01 10*3/uL (ref 0.00–0.07)
Basophils Absolute: 0 10*3/uL (ref 0.0–0.1)
Basophils Relative: 1 %
Eosinophils Absolute: 0.2 10*3/uL (ref 0.0–0.5)
Eosinophils Relative: 4 %
HCT: 42.6 % (ref 39.0–52.0)
Hemoglobin: 14.3 g/dL (ref 13.0–17.0)
Immature Granulocytes: 0 %
Lymphocytes Relative: 34 %
Lymphs Abs: 2 10*3/uL (ref 0.7–4.0)
MCH: 30.9 pg (ref 26.0–34.0)
MCHC: 33.6 g/dL (ref 30.0–36.0)
MCV: 92 fL (ref 80.0–100.0)
Monocytes Absolute: 0.5 10*3/uL (ref 0.1–1.0)
Monocytes Relative: 8 %
Neutro Abs: 3.1 10*3/uL (ref 1.7–7.7)
Neutrophils Relative %: 53 %
Platelets: 268 10*3/uL (ref 150–400)
RBC: 4.63 MIL/uL (ref 4.22–5.81)
RDW: 12.4 % (ref 11.5–15.5)
WBC: 5.8 10*3/uL (ref 4.0–10.5)
nRBC: 0 % (ref 0.0–0.2)

## 2020-10-19 LAB — COMPREHENSIVE METABOLIC PANEL
ALT: 30 U/L (ref 0–44)
AST: 20 U/L (ref 15–41)
Albumin: 3.5 g/dL (ref 3.5–5.0)
Alkaline Phosphatase: 94 U/L (ref 38–126)
Anion gap: 8 (ref 5–15)
BUN: 12 mg/dL (ref 6–20)
CO2: 28 mmol/L (ref 22–32)
Calcium: 8.4 mg/dL — ABNORMAL LOW (ref 8.9–10.3)
Chloride: 100 mmol/L (ref 98–111)
Creatinine, Ser: 0.94 mg/dL (ref 0.61–1.24)
GFR, Estimated: >60 mL/min (ref >60)
Glucose, Bld: 116 mg/dL — ABNORMAL HIGH (ref 70–99)
Potassium: 3.8 mmol/L (ref 3.5–5.1)
Sodium: 136 mmol/L (ref 135–145)
Total Bilirubin: 0.6 mg/dL (ref 0.3–1.2)
Total Protein: 6.8 g/dL (ref 6.5–8.1)

## 2020-10-19 LAB — URIC ACID: Uric Acid, Serum: 7.6 mg/dL (ref 3.7–8.6)

## 2020-10-19 MED ORDER — OXYCODONE-ACETAMINOPHEN 5-325 MG PO TABS
1.0000 | ORAL_TABLET | Freq: Four times a day (QID) | ORAL | 0 refills | Status: DC | PRN
Start: 2020-10-19 — End: 2020-11-19

## 2020-10-19 MED ORDER — PREDNISONE 10 MG PO TABS
20.0000 mg | ORAL_TABLET | Freq: Every day | ORAL | 0 refills | Status: DC
Start: 2020-10-19 — End: 2020-10-29

## 2020-10-19 NOTE — ED Triage Notes (Signed)
Patient c/o right great toe pain that started last night without injury. Per patient hx of gout in which this feels similar. Patient has allopurinol but states he has not been taking it.

## 2020-10-19 NOTE — ED Notes (Signed)
Went to do vitals pt did not want bp cuff placed

## 2020-10-19 NOTE — ED Provider Notes (Signed)
[facility] EMERGENCY DEPARTMENT Provider Note   CSN: [id number] Arrival date & time: 10/19/20  5035     History Chief Complaint  Patient presents with  . Toe Pain    Martin Bruce is a 45 y.o. male.  Patient complains of pain to his right great toe.  He has history of gout..  No fever no chills no vomiting  The history is provided by the patient and medical records. No language interpreter was used.  Toe Pain This is a new problem. The current episode started 6 to 12 hours ago. The problem occurs constantly. The problem has not changed since onset.Pertinent negatives include no chest pain, no abdominal pain and no headaches. Nothing aggravates the symptoms. Nothing relieves the symptoms. He has tried nothing for the symptoms. The treatment provided no relief.       Past Medical History:  Diagnosis Date  . Anxiety   . Asthma   . Gout     Patient Active Problem List   Diagnosis Date Noted  . Asthma 09/10/2020  . Encounter to establish care 06/19/2020  . Tear of right rotator cuff 06/19/2020  . Gastroesophageal reflux disease 06/19/2020  . Anxiety with depression 06/19/2020  . Chronic, continuous use of opioids 06/19/2020  . Gout 06/19/2020  . Vaccine refused by patient 06/19/2020  . Mild intermittent asthma without complication 06/19/2020  . Psychosis (HCC) 06/19/2020  . Chronic pain of both shoulders 06/12/2016  . Moderate persistent asthma with exacerbation 06/12/2016  . Carpal tunnel syndrome 05/09/2013    Past Surgical History:  Procedure Laterality Date  . CARPAL TUNNEL RELEASE         Family History  Problem Relation Age of Onset  . Hypertension Mother   . Cancer Father     Social History   Tobacco Use  . Smoking status: Passive Smoke Exposure - Never Smoker  . Smokeless tobacco: Never Used  Vaping Use  . Vaping Use: Never used  Substance Use Topics  . Alcohol use: Yes    Comment: occ  . Drug use: Yes    Types: Marijuana    Comment:  medical marijuana    Home Medications Prior to Admission medications   Medication Sig Start Date End Date Taking? Authorizing Provider  oxyCODONE-acetaminophen (PERCOCET) 5-325 MG tablet Take 1 tablet by mouth every 6 (six) hours as needed. 10/19/20  Yes Bethann Berkshire, MD  predniSONE (DELTASONE) 10 MG tablet Take 2 tablets (20 mg total) by mouth daily. 10/19/20  Yes Bethann Berkshire, MD  albuterol (PROVENTIL) (2.5 MG/3ML) 0.083% nebulizer solution Take 2.5 mg by nebulization every 6 (six) hours as needed for wheezing or shortness of breath.    [provider]  albuterol (VENTOLIN HFA) 108 (90 Base) MCG/ACT inhaler Inhale 2 puffs into the lungs every 6 (six) hours as needed for wheezing or shortness of breath. 06/19/20   Anabel Halon, MD  alprazolam Prudy Feeler) 2 MG tablet Take 2 mg by mouth 3 (three) times daily.    [provider]  cyclobenzaprine (FLEXERIL) 10 MG tablet Take by mouth. 12/25/16   [provider]  FLUoxetine (PROZAC) 20 MG capsule Take 20 mg by mouth daily.    [provider]  fluticasone (FLOVENT HFA) 110 MCG/ACT inhaler Inhale into the lungs. 12/25/16   [provider]  HYDROcodone-acetaminophen (NORCO/VICODIN) 5-325 MG tablet One tablet every four hours for pain. 10/02/20   Darreld Mclean, MD  naproxen (NAPROSYN) 500 MG tablet Take 1 tablet (500 mg total) by  mouth 2 (two) times daily with a meal. 07/04/20   Darreld Mclean, MD  ondansetron (ZOFRAN ODT) 4 MG disintegrating tablet Take 1 tablet (4 mg total) by mouth every 8 (eight) hours as needed for nausea or vomiting. 04/21/19   Khatri, Hina, PA-C  pantoprazole (PROTONIX) 20 MG tablet Take 1 tablet (20 mg total) by mouth daily. 06/07/19   Triplett, Tammy, PA-C  sucralfate (CARAFATE) 1 g tablet Take 1 tablet (1 g total) by mouth 2 (two) times daily. 06/06/16   Rebecka Apley, MD  triamcinolone (KENALOG) 0.1 % Apply topically. 08/02/19   [provider]  ziprasidone (GEODON) 80 MG  capsule Take 80 mg by mouth at bedtime.    [provider]    Allergies    Penicillins  Review of Systems   Review of Systems  Constitutional: Negative for appetite change and fatigue.  HENT: Negative for congestion, ear discharge and sinus pressure.   Eyes: Negative for discharge.  Respiratory: Negative for cough.   Cardiovascular: Negative for chest pain.  Gastrointestinal: Negative for abdominal pain and diarrhea.  Genitourinary: Negative for frequency and hematuria.  Musculoskeletal: Negative for back pain.       Pain right great toe  Skin: Negative for rash.  Neurological: Negative for seizures and headaches.  Psychiatric/Behavioral: Negative for hallucinations.    Physical Exam Updated Vital Signs BP 129/76 (BP Location: Right Arm)   Pulse 68   Temp 98.5 F (36.9 C) (Oral)   Resp 18   Ht 6\' 2"  (1.88 m)   Wt 136.1 kg   SpO2 97%   BMI 38.52 kg/m   Physical Exam Vitals and nursing note reviewed.  Constitutional:      Appearance: He is well-developed.  HENT:     Head: Normocephalic.     Nose: Nose normal.  Eyes:     General: No scleral icterus.    Conjunctiva/sclera: Conjunctivae normal.  Neck:     Thyroid: No thyromegaly.  Cardiovascular:     Rate and Rhythm: Normal rate and regular rhythm.     Heart sounds: No murmur heard. No friction rub. No gallop.   Pulmonary:     Breath sounds: No stridor. No wheezing or rales.  Chest:     Chest wall: No tenderness.  Abdominal:     General: There is no distension.     Tenderness: There is no abdominal tenderness. There is no rebound.  Musculoskeletal:     Cervical back: Neck supple.     Comments: Tenderness to right MCP  Lymphadenopathy:     Cervical: No cervical adenopathy.  Skin:    Findings: No erythema or rash.  Neurological:     Mental Status: He is alert and oriented to person, place, and time.     Motor: No abnormal muscle tone.     Coordination: Coordination normal.  Psychiatric:         Behavior: Behavior normal.     ED Results / Procedures / Treatments   Labs (all labs ordered are listed, but only abnormal results are displayed) Labs Reviewed  COMPREHENSIVE METABOLIC PANEL - Abnormal; Notable for the following components:      Result Value   Glucose, Bld 116 (*)    Calcium 8.4 (*)    All other components within normal limits  CBC WITH DIFFERENTIAL/PLATELET  URIC ACID    EKG None  Radiology DG Foot Complete Right  Result Date: 10/19/2020 CLINICAL DATA:  Great toe pain without trauma EXAM: RIGHT FOOT COMPLETE -  3+ VIEW COMPARISON:  None. FINDINGS: There is no evidence of fracture or dislocation. There is no evidence of arthropathy or other focal bone abnormality. Soft tissues are unremarkable. IMPRESSION: Negative. Electronically Signed   By: Corlis Leak M.D.   On: 10/19/2020 08:59    Procedures Procedures   Medications Ordered in ED Medications - No data to display  ED Course  I have reviewed the triage vital signs and the nursing notes.  Pertinent labs & imaging results that were available during my care of the patient were reviewed by me and considered in my medical decision making (see chart for details).    MDM Rules/Calculators/A&P                          Patient with gout in his right great toe.  He is put on prednisone and Percocet Final Clinical Impression(s) / ED Diagnoses Final diagnoses:  Idiopathic gout, unspecified chronicity, unspecified site    Rx / DC Orders ED Discharge Orders         Ordered    predniSONE (DELTASONE) 10 MG tablet  Daily        10/19/20 1034    oxyCODONE-acetaminophen (PERCOCET) 5-325 MG tablet  Every 6 hours PRN        10/19/20 1034           Bethann Berkshire, MD 10/23/20 1030

## 2020-10-19 NOTE — Discharge Instructions (Addendum)
Follow-up with your family doctor next week for recheck. 

## 2020-10-24 ENCOUNTER — Telehealth: Payer: Self-pay | Admitting: Orthopaedic Surgery

## 2020-10-24 NOTE — Telephone Encounter (Signed)
Patient requests prescription for Hydrocodone/Acetaminophen 5-325 mgs.  Qty  30  Sig: One tablet every four hours for pain.  [facility] [address]

## 2020-10-24 NOTE — Telephone Encounter (Signed)
No, he got percocet from ER 10-19-20

## 2020-10-24 NOTE — Telephone Encounter (Signed)
I spoke with patient and relayed Dr. Sanjuan Dame reply. He stated he understood.

## 2020-10-29 ENCOUNTER — Encounter: Payer: Self-pay | Admitting: Orthopaedic Surgery

## 2020-10-29 ENCOUNTER — Other Ambulatory Visit: Payer: Self-pay

## 2020-10-29 ENCOUNTER — Ambulatory Visit (INDEPENDENT_AMBULATORY_CARE_PROVIDER_SITE_OTHER): Payer: Medicare Other | Admitting: Orthopaedic Surgery

## 2020-10-29 VITALS — BP 143/98 | HR 66 | Ht 73.0 in

## 2020-10-29 DIAGNOSIS — M1A09X0 Idiopathic chronic gout, multiple sites, without tophus (tophi): Secondary | ICD-10-CM | POA: Diagnosis not present

## 2020-10-29 DIAGNOSIS — G8929 Other chronic pain: Secondary | ICD-10-CM

## 2020-10-29 DIAGNOSIS — Z6841 Body Mass Index (BMI) 40.0 and over, adult: Secondary | ICD-10-CM | POA: Diagnosis not present

## 2020-10-29 DIAGNOSIS — M25511 Pain in right shoulder: Secondary | ICD-10-CM | POA: Diagnosis not present

## 2020-10-29 MED ORDER — PREDNISONE 5 MG (21) PO TBPK: ORAL_TABLET | ORAL | 0 refills | Status: DC

## 2020-10-29 MED ORDER — HYDROCODONE-ACETAMINOPHEN 5-325 MG PO TABS: ORAL_TABLET | ORAL | 0 refills | Status: DC

## 2020-10-29 NOTE — Progress Notes (Signed)
My right shoulder, my right knee and my right foot are hurting.  He has gout and his right great toe and foot are tender but he has no redness or swelling.  He is taking his gout medicine.  His right shoulder is tender. The injection last time did not last very long.  He has pain with overhead use and sleeping on it.  He is going to [place] again this Saturday.  He travels a lot.  He is concerned about his pains and the pains in various locations.  I will give him Prednisone dose pack.  I will give Pain medicine as back up.  He is to continue his gout medicine.  Encounter Diagnoses  Name Primary?  . Chronic right shoulder pain Yes  . Body mass index 45.0-49.9, adult (HCC)   . Morbid obesity (HCC)   . Idiopathic chronic gout of multiple sites without tophus    I will see as needed.    Call if any problem.  Precautions discussed.  I have reviewed the [place] Controlled Substance Reporting System web site prior to prescribing narcotic medicine for this patient.      Electronically Signed Darreld Mclean, MD 5/24/202212:23 PM

## 2020-11-18 ENCOUNTER — Telehealth: Payer: Self-pay | Admitting: Orthopaedic Surgery

## 2020-11-18 NOTE — Telephone Encounter (Signed)
Done

## 2020-11-18 NOTE — Telephone Encounter (Signed)
Martin Bruce called back at 3:55 this afternoon wanting to know why his prescription has not been done by Dr. Hilda Lias.  I told him that Dr. Hilda Lias is not in the office today although sometimes he does check his messages.  I told him that he could check with the pharmacy once more this afternoon and if it was not done at that time, Dr. Hilda Lias would be in the morning to do his messages then.  Martin Bruce got very upset with me and said "what I am supposed to do? My M----- F------ tooth is killing me.  I asked him not to speak to me that way.  I did tell him that if he was having a problem with his tooth that he should call his dentist.  He asked my name which I did give to him.   He then said he wanted to speak to someone above me.  I told him that my supervisor was out of the office at this time. He said he wanted to talk to someone who knows more than me.   I offered to let him to speak to another employee here but he got angry and hung up.

## 2020-11-18 NOTE — Telephone Encounter (Signed)
Patient called for refill: oxyCODONE-acetaminophen (PERCOCET) 5-325 MG tablet 20 tablet  [facility], [address], [place]

## 2020-11-19 MED ORDER — HYDROCODONE-ACETAMINOPHEN 5-325 MG PO TABS: ORAL_TABLET | ORAL | 0 refills | Status: DC

## 2020-11-19 NOTE — Telephone Encounter (Signed)
Patient called back and requests to speak with Dr Sanjuan Dame nurse/assistant. Routing accordingly.

## 2020-11-21 ENCOUNTER — Other Ambulatory Visit: Payer: Self-pay | Admitting: Internal Medicine

## 2020-11-21 DIAGNOSIS — F418 Other specified anxiety disorders: Secondary | ICD-10-CM

## 2020-11-26 ENCOUNTER — Ambulatory Visit: Payer: Medicare Other | Admitting: Orthopaedic Surgery

## 2020-12-04 ENCOUNTER — Ambulatory Visit (INDEPENDENT_AMBULATORY_CARE_PROVIDER_SITE_OTHER): Payer: Medicare Other

## 2020-12-04 ENCOUNTER — Other Ambulatory Visit: Payer: Self-pay

## 2020-12-04 DIAGNOSIS — Z0000 Encounter for general adult medical examination without abnormal findings: Secondary | ICD-10-CM

## 2020-12-04 DIAGNOSIS — Z1211 Encounter for screening for malignant neoplasm of colon: Secondary | ICD-10-CM

## 2020-12-04 DIAGNOSIS — Z0100 Encounter for examination of eyes and vision without abnormal findings: Secondary | ICD-10-CM

## 2020-12-04 NOTE — Progress Notes (Signed)
Subjective:   Martin Bruce is a 45 y.o. male who presents for Medicare Annual/Subsequent preventive examination.        Objective:    There were no vitals filed for this visit. There is no height or weight on file to calculate BMI.  Advanced Directives 10/19/2020 08/09/2020 01/15/2020 06/07/2019 04/21/2019 04/20/2019 12/26/2018  Does Patient Have a Medical Advance Directive? No No No No No No No  Would patient like information on creating a medical advance directive? - Yes (MAU/Ambulatory/Procedural Areas - Information given) - - No - Patient declined - No - Patient declined    Current Medications (verified) Outpatient Encounter Medications as of 12/04/2020  Medication Sig   albuterol (PROVENTIL) (2.5 MG/3ML) 0.083% nebulizer solution Take 2.5 mg by nebulization every 6 (six) hours as needed for wheezing or shortness of breath.   albuterol (VENTOLIN HFA) 108 (90 Base) MCG/ACT inhaler INHALE 2 PUFFS INTO THE LUNGS EVERY 6 HOURS AS NEEDED FOR WHEEZING OR SHORTNESS OF BREATH   alprazolam (XANAX) 2 MG tablet Take 2 mg by mouth 3 (three) times daily.   cyclobenzaprine (FLEXERIL) 10 MG tablet Take by mouth.   FLUoxetine (PROZAC) 20 MG capsule Take 20 mg by mouth daily.   fluticasone (FLOVENT HFA) 110 MCG/ACT inhaler Inhale into the lungs.   HYDROcodone-acetaminophen (NORCO/VICODIN) 5-325 MG tablet One tablet every six hours for pain.  Limit 7 days.   naproxen (NAPROSYN) 500 MG tablet Take 1 tablet (500 mg total) by mouth 2 (two) times daily with a meal.   ondansetron (ZOFRAN ODT) 4 MG disintegrating tablet Take 1 tablet (4 mg total) by mouth every 8 (eight) hours as needed for nausea or vomiting.   pantoprazole (PROTONIX) 20 MG tablet Take 1 tablet (20 mg total) by mouth daily.   predniSONE (STERAPRED UNI-PAK 21 TAB) 5 MG (21) TBPK tablet Take 6 pills first day; 5 pills second day; 4 pills third day; 3 pills fourth day; 2 pills next day and 1 pill last day.   sucralfate (CARAFATE) 1 g tablet  Take 1 tablet (1 g total) by mouth 2 (two) times daily.   triamcinolone (KENALOG) 0.1 % Apply topically.   ziprasidone (GEODON) 80 MG capsule Take 80 mg by mouth at bedtime.   No facility-administered encounter medications on file as of 12/04/2020.    Allergies (verified) Penicillins   History: Past Medical History:  Diagnosis Date   Anxiety    Asthma    Gout    Past Surgical History:  Procedure Laterality Date   CARPAL TUNNEL RELEASE     Family History  Problem Relation Age of Onset   Hypertension Mother    Cancer Father    Social History   Socioeconomic History   Marital status: Single    Spouse name: Not on file   Number of children: Not on file   Years of education: Not on file   Highest education level: Not on file  Occupational History   Not on file  Tobacco Use   Smoking status: Passive Smoke Exposure - Never Smoker   Smokeless tobacco: Never  Vaping Use   Vaping Use: Never used  Substance and Sexual Activity   Alcohol use: Yes    Comment: occ   Drug use: Yes    Types: Marijuana    Comment: medical marijuana   Sexual activity: Not Currently  Other Topics Concern   Not on file  Social History Narrative   Not on file   Social Determinants of Health  Financial Resource Strain: Not on file  Food Insecurity: Not on file  Transportation Needs: Not on file  Physical Activity: Not on file  Stress: Not on file  Social Connections: Not on file    Tobacco Counseling Counseling given: Not Answered   Clinical Intake:                 Diabetic? No          Activities of Daily Living No flowsheet data found.  Patient Care Team: Anabel Halon, MD as PCP - General (Internal Medicine)  Indicate any recent Medical Services you may have received from other than [facility] providers in the past year (date may be approximate).     Assessment:   This is a routine wellness examination for Martin Bruce.  Hearing/Vision screen No results  found.  Dietary issues and exercise activities discussed:     Goals Addressed   None   Depression Screen PHQ 2/9 Scores 06/19/2020  PHQ - 2 Score 0    Fall Risk Fall Risk  06/19/2020  Falls in the past year? 0  Number falls in past yr: 0  Injury with Fall? 0  Risk for fall due to : No Fall Risks  Follow up Falls evaluation completed    FALL RISK PREVENTION PERTAINING TO THE HOME:  Any stairs in or around the home? No  If so, are there any without handrails? No  Home free of loose throw rugs in walkways, pet beds, electrical cords, etc? Yes  Adequate lighting in your home to reduce risk of falls? Yes   ASSISTIVE DEVICES UTILIZED TO PREVENT FALLS:  Life alert? No  Use of a cane, walker or w/c? No  Grab bars in the bathroom? No  Shower chair or bench in shower? No  Elevated toilet seat or a handicapped toilet? No   TIMED UP AND GO:  Was the test performed? No .    Cognitive Function:        Immunizations  There is no immunization history on file for this patient.  TDAP status: Due, Education has been provided regarding the importance of this vaccine. Advised may receive this vaccine at local pharmacy or Health Dept. Aware to provide a copy of the vaccination record if obtained from local pharmacy or Health Dept. Verbalized acceptance and understanding.  Flu Vaccine status: Due, Education has been provided regarding the importance of this vaccine. Advised may receive this vaccine at local pharmacy or Health Dept. Aware to provide a copy of the vaccination record if obtained from local pharmacy or Health Dept. Verbalized acceptance and understanding.  Pneumococcal vaccine status: Declined,  Education has been provided regarding the importance of this vaccine but patient still declined. Advised may receive this vaccine at local pharmacy or Health Dept. Aware to provide a copy of the vaccination record if obtained from local pharmacy or Health Dept. Verbalized acceptance  and understanding.   Covid-19 vaccine status: Declined, Education has been provided regarding the importance of this vaccine but patient still declined. Advised may receive this vaccine at local pharmacy or Health Dept.or vaccine clinic. Aware to provide a copy of the vaccination record if obtained from local pharmacy or Health Dept. Verbalized acceptance and understanding.  Qualifies for Shingles Vaccine? No   Zostavax completed No   Shingrix Completed?: No.    Education has been provided regarding the importance of this vaccine. Patient has been advised to call insurance company to determine out of pocket expense if they have not  yet received this vaccine. Advised may also receive vaccine at local pharmacy or Health Dept. Verbalized acceptance and understanding.  Screening Tests Health Maintenance  Topic Date Due   COVID-19 Vaccine (1) Never done   HIV Screening  Never done   Hepatitis C Screening  Never done   TETANUS/TDAP  06/08/2018   COLONOSCOPY (Pts 45-73yrs Insurance coverage will need to be confirmed)  Never done   INFLUENZA VACCINE  01/06/2021   Pneumococcal Vaccine 66-83 Years old  Aged Out   HPV VACCINES  Aged Out    Health Maintenance  Health Maintenance Due  Topic Date Due   COVID-19 Vaccine (1) Never done   HIV Screening  Never done   Hepatitis C Screening  Never done   TETANUS/TDAP  06/08/2018   COLONOSCOPY (Pts 45-75yrs Insurance coverage will need to be confirmed)  Never done    Colorectal cancer screening: Referral to GI placed to [facility]. Pt aware the office will call re: appt.  Lung Cancer Screening: (Low Dose CT Chest recommended if Age 15-80 years, 30 pack-year currently smoking OR have quit w/in 15years.) does not qualify.    Additional Screening:  Hepatitis C Screening: does qualify.   Vision Screening: Recommended annual ophthalmology exams for early detection of glaucoma and other disorders of the eye. Is the patient up to date with their  annual eye exam?  Yes   Who is the provider or what is the name of the office in which the patient attends annual eye exams? Pt does not currently have an eye doctor.   If pt is not established with a provider, would they like to be referred to a provider to establish care? Yes .   Dental Screening: Recommended annual dental exams for proper oral hygiene  Community Resource Referral / Chronic Care Management: CRR required this visit?  No   CCM required this visit?  No      Plan:     I have personally reviewed and noted the following in the patient's chart:   Medical and social history Use of alcohol, tobacco or illicit drugs  Current medications and supplements including opioid prescriptions. Patient is not currently taking opioid prescriptions. Functional ability and status Nutritional status Physical activity Advanced directives List of other physicians Hospitalizations, surgeries, and ER visits in previous 12 months Vitals Screenings to include cognitive, depression, and falls Referrals and appointments  In addition, I have reviewed and discussed with patient certain preventive protocols, quality metrics, and best practice recommendations. A written personalized care plan for preventive services as well as general preventive health recommendations were provided to patient.     Dellia Cloud, LPN   10/16/2583   Nurse Notes: Pt consents to AWV via telephone. Pt was present at his home during the call with provider on site at the time. This call took approx. 20 min to conduct and pt was still experiencing some pain from recent dental work in which he is currently being treated for by his dentist. Pt declines vaccines at this time that he is eligible for.

## 2020-12-04 NOTE — Patient Instructions (Signed)
Martin Bruce , Thank you for taking time to come for your Medicare Wellness Visit. I appreciate your ongoing commitment to your health goals. Please review the following plan we discussed and let me know if I can assist you in the future.   Screening recommendations/referrals: Colonoscopy: Orders entered for colonoscopy.   Recommended yearly ophthalmology/optometry visit for glaucoma screening and checkup: Referral for eye exam sent, they will call to set up appt.   Recommended yearly dental visit for hygiene and checkup  Vaccinations: Influenza vaccine: Refused  Pneumococcal vaccine: Refused  Tdap vaccine: Eligible, pt refuses vaccine at this time.  Shingles vaccine: Not eligible at this time.     Advanced directives: Not at this time.   Conditions/risks identified: Pt is being sent financial resources via [facility].   Next appointment: None scheduled at this time.   Preventive Care 40-64 Years, Male Preventive care refers to lifestyle choices and visits with your health care provider that can promote health and wellness. What does preventive care include?  A yearly physical exam. This is also called an annual well check. Dental exams once or twice a year. Routine eye exams. Ask your health care provider how often you should have your eyes checked. Personal lifestyle choices, including: Daily care of your teeth and gums. Regular physical activity. Eating a healthy diet. Avoiding tobacco and drug use. Limiting alcohol use. Practicing safe sex. Taking low-dose aspirin every day starting at age 17. What happens during an annual well check? The services and screenings done by your health care provider during your annual well check will depend on your age, overall health, lifestyle risk factors, and family history of disease. Counseling  Your health care provider may ask you questions about your: Alcohol use. Tobacco use. Drug use. Emotional  well-being. Home and relationship well-being. Sexual activity. Eating habits. Work and work Astronomer. Screening  You may have the following tests or measurements: Height, weight, and BMI. Blood pressure. Lipid and cholesterol levels. These may be checked every 5 years, or more frequently if you are over 21 years old. Skin check. Lung cancer screening. You may have this screening every year starting at age 1 if you have a 30-pack-year history of smoking and currently smoke or have quit within the past 15 years. Fecal occult blood test (FOBT) of the stool. You may have this test every year starting at age 91. Flexible sigmoidoscopy or colonoscopy. You may have a sigmoidoscopy every 5 years or a colonoscopy every 10 years starting at age 23. Prostate cancer screening. Recommendations will vary depending on your family history and other risks. Hepatitis C blood test. Hepatitis B blood test. Sexually transmitted disease (STD) testing. Diabetes screening. This is done by checking your blood sugar (glucose) after you have not eaten for a while (fasting). You may have this done every 1-3 years. Discuss your test results, treatment options, and if necessary, the need for more tests with your health care provider. Vaccines  Your health care provider may recommend certain vaccines, such as: Influenza vaccine. This is recommended every year. Tetanus, diphtheria, and acellular pertussis (Tdap, Td) vaccine. You may need a Td booster every 10 years. Zoster vaccine. You may need this after age 64. Pneumococcal 13-valent conjugate (PCV13) vaccine. You may need this if you have certain conditions and have not been vaccinated. Pneumococcal polysaccharide (PPSV23) vaccine. You may need one or two doses if you smoke cigarettes or if you have certain conditions. Talk to your health care provider about  which screenings and vaccines you need and how often you need them. This information is not intended to  replace advice given to you by your health care provider. Make sure you discuss any questions you have with your health care provider. Document Released: 06/21/2015 Document Revised: 02/12/2016 Document Reviewed: 03/26/2015 Elsevier Interactive Patient Education  2017 [place] Prevention in the Home Falls can cause injuries. They can happen to people of all ages. There are many things you can do to make your home safe and to help prevent falls. What can I do on the outside of my home? Regularly fix the edges of walkways and driveways and fix any cracks. Remove anything that might make you trip as you walk through a door, such as a raised step or threshold. Trim any bushes or trees on the path to your home. Use bright outdoor lighting. Clear any walking paths of anything that might make someone trip, such as rocks or tools. Regularly check to see if handrails are loose or broken. Make sure that both sides of any steps have handrails. Any raised decks and porches should have guardrails on the edges. Have any leaves, snow, or ice cleared regularly. Use sand or salt on walking paths during winter. Clean up any spills in your garage right away. This includes oil or grease spills. What can I do in the bathroom? Use night lights. Install grab bars by the toilet and in the tub and shower. Do not use towel bars as grab bars. Use non-skid mats or decals in the tub or shower. If you need to sit down in the shower, use a plastic, non-slip stool. Keep the floor dry. Clean up any water that spills on the floor as soon as it happens. Remove soap buildup in the tub or shower regularly. Attach bath mats securely with double-sided non-slip rug tape. Do not have throw rugs and other things on the floor that can make you trip. What can I do in the bedroom? Use night lights. Make sure that you have a light by your bed that is easy to reach. Do not use any sheets or blankets that are too big for  your bed. They should not hang down onto the floor. Have a firm chair that has side arms. You can use this for support while you get dressed. Do not have throw rugs and other things on the floor that can make you trip. What can I do in the kitchen? Clean up any spills right away. Avoid walking on wet floors. Keep items that you use a lot in easy-to-reach places. If you need to reach something above you, use a strong step stool that has a grab bar. Keep electrical cords out of the way. Do not use floor polish or wax that makes floors slippery. If you must use wax, use non-skid floor wax. Do not have throw rugs and other things on the floor that can make you trip. What can I do with my stairs? Do not leave any items on the stairs. Make sure that there are handrails on both sides of the stairs and use them. Fix handrails that are broken or loose. Make sure that handrails are as long as the stairways. Check any carpeting to make sure that it is firmly attached to the stairs. Fix any carpet that is loose or worn. Avoid having throw rugs at the top or bottom of the stairs. If you do have throw rugs, attach them to the floor with  carpet tape. Make sure that you have a light switch at the top of the stairs and the bottom of the stairs. If you do not have them, ask someone to add them for you. What else can I do to help prevent falls? Wear shoes that: Do not have high heels. Have rubber bottoms. Are comfortable and fit you well. Are closed at the toe. Do not wear sandals. If you use a stepladder: Make sure that it is fully opened. Do not climb a closed stepladder. Make sure that both sides of the stepladder are locked into place. Ask someone to hold it for you, if possible. Clearly mark and make sure that you can see: Any grab bars or handrails. First and last steps. Where the edge of each step is. Use tools that help you move around (mobility aids) if they are needed. These  include: Canes. Walkers. Scooters. Crutches. Turn on the lights when you go into a dark area. Replace any light bulbs as soon as they burn out. Set up your furniture so you have a clear path. Avoid moving your furniture around. If any of your floors are uneven, fix them. If there are any pets around you, be aware of where they are. Review your medicines with your doctor. Some medicines can make you feel dizzy. This can increase your chance of falling. Ask your doctor what other things that you can do to help prevent falls. This information is not intended to replace advice given to you by your health care provider. Make sure you discuss any questions you have with your health care provider. Document Released: 03/21/2009 Document Revised: 10/31/2015 Document Reviewed: 06/29/2014 Elsevier Interactive Patient Education  2017 [employer].

## 2020-12-04 NOTE — Addendum Note (Signed)
Addended by: Dellia Cloud on: 12/04/2020 02:50 PM   Modules accepted: Orders

## 2020-12-05 ENCOUNTER — Other Ambulatory Visit: Payer: Self-pay

## 2020-12-05 DIAGNOSIS — F418 Other specified anxiety disorders: Secondary | ICD-10-CM

## 2020-12-05 DIAGNOSIS — Z599 Problem related to housing and economic circumstances, unspecified: Secondary | ICD-10-CM

## 2020-12-05 DIAGNOSIS — F29 Unspecified psychosis not due to a substance or known physiological condition: Secondary | ICD-10-CM

## 2020-12-06 ENCOUNTER — Telehealth: Payer: Self-pay

## 2020-12-06 NOTE — Telephone Encounter (Signed)
   Telephone encounter was:  Unsuccessful.  12/06/2020 Name: Martin Bruce MRN: [id number] DOB: 1975/10/23  Unsuccessful outbound call made today to assist with:   financial strain, food insecurity, housing, socia l connections. Unable to leave message voicemail is full.  Outreach Attempt:  1st Attempt  Annie Saephan, AAS Paralegal, [facility] Care Guide  [facility]ded Care Coordination [place]  Care Management  [address] [place], [place] [address] ??[email or web address]  ?? [phone]   [email or web address]

## 2020-12-11 ENCOUNTER — Encounter: Payer: Self-pay | Admitting: Internal Medicine

## 2020-12-12 ENCOUNTER — Telehealth: Payer: Self-pay | Admitting: Orthopaedic Surgery

## 2020-12-12 NOTE — Telephone Encounter (Signed)
Patient called request refill on pain medicine    HYDROcodone-acetaminophen (NORCO/VICODIN) 5-325 MG tablet   Pharmacy :  [facility] [address] 

## 2020-12-16 ENCOUNTER — Other Ambulatory Visit: Payer: Self-pay | Admitting: Internal Medicine

## 2020-12-16 DIAGNOSIS — F418 Other specified anxiety disorders: Secondary | ICD-10-CM

## 2020-12-16 MED ORDER — HYDROCODONE-ACETAMINOPHEN 5-325 MG PO TABS: ORAL_TABLET | ORAL | 0 refills | Status: DC

## 2020-12-17 ENCOUNTER — Telehealth: Payer: Self-pay

## 2020-12-17 NOTE — Telephone Encounter (Signed)
   Telephone encounter was:  Successful.  12/17/2020 Name: JAVIER GELL MRN: [id number] DOB: 07/18/1975  Martin Bruce is a 45 y.o. year old male who is a primary care patient of Anabel Halon, MD . The community resource team was consulted for assistance with  food, utility and housing.  Care guide performed the following interventions: Spoke with patient confirmed email address [email or web address] emailed information for food pantries, utility assistance and housing.  Patient requested that I email resources for the [place] area.    Follow Up Plan:  Care guide will follow up with patient by phone over the next 7 days  Chameka Mcmullen, AAS Paralegal, [facility] Care Guide  Embedded Care Coordination [facility]  Care Management  [address] [place], [place] [address] ??[email or web address]  ?? [phone]   [email or web address]

## 2020-12-18 ENCOUNTER — Telehealth: Payer: Self-pay

## 2020-12-18 NOTE — Telephone Encounter (Signed)
   Telephone encounter was:  Successful.  12/18/2020 Name: OLSEN MCCUTCHAN MRN: [id number] DOB: 02-Jul-1975  JARRAH BABICH is a 45 y.o. year old male who is a primary care patient of Anabel Halon, MD . The community resource team was consulted for assistance with  food, utility and housing.  Care guide performed the following interventions: Spoke with patient he has received emailed resources for food, utility and housing.  Patient stated he does not need further assistance at this time.    Follow Up Plan:  No further follow up planned at this time. The patient has been provided with needed resources.  Sachin Ferencz, AAS Paralegal, [facility] Care Guide  Embedded Care Coordination [place]  Care Management  [address] [place], [place] [address] ??[email or web address]  ?? [phone]   [email or web address]

## 2020-12-24 ENCOUNTER — Other Ambulatory Visit: Payer: Self-pay

## 2020-12-24 ENCOUNTER — Encounter: Payer: Self-pay | Admitting: Orthopaedic Surgery

## 2020-12-24 ENCOUNTER — Ambulatory Visit (INDEPENDENT_AMBULATORY_CARE_PROVIDER_SITE_OTHER): Payer: Medicare Other | Admitting: Orthopaedic Surgery

## 2020-12-24 VITALS — BP 143/103 | HR 64 | Ht 74.0 in | Wt 384.6 lb

## 2020-12-24 DIAGNOSIS — G8929 Other chronic pain: Secondary | ICD-10-CM | POA: Diagnosis not present

## 2020-12-24 DIAGNOSIS — M25511 Pain in right shoulder: Secondary | ICD-10-CM

## 2020-12-24 DIAGNOSIS — Z6841 Body Mass Index (BMI) 40.0 and over, adult: Secondary | ICD-10-CM | POA: Diagnosis not present

## 2020-12-24 NOTE — Patient Instructions (Addendum)
Voltaren Gel- Rub into painful area as needed for pain   Consider making an appointment with Therapy to try a TENS unit.

## 2020-12-24 NOTE — Progress Notes (Signed)
My shoulder still hurts.  He has chronic pain of the right shoulder. He travels often.  He has no new trauma.  His MRI was negative, he has been to PT.  He has tried various medicines and rubs.  He still has pain.  He has no new trauma, no numbness.  ROM of the shoulder on the right is full but tender in the extremes.  NV intact.  Encounter Diagnoses  Name Primary?   Chronic right shoulder pain Yes   Body mass index 45.0-49.9, adult (HCC)    Morbid obesity (HCC)    I will him go to OT and see about a TENS unit.  Continue the exercises.  Return in six weeks.  Call if any problem.  Precautions discussed.  Electronically Signed Darreld Mclean, MD 7/19/20229:39 AM

## 2020-12-30 NOTE — Telephone Encounter (Signed)
Done

## 2021-01-01 ENCOUNTER — Telehealth: Payer: Self-pay | Admitting: Orthopaedic Surgery

## 2021-01-01 NOTE — Telephone Encounter (Signed)
Patient called request refill on pain medicine    HYDROcodone-acetaminophen (NORCO/VICODIN) 5-325 MG tablet   Pharmacy :  [facility] [address]

## 2021-01-02 MED ORDER — HYDROCODONE-ACETAMINOPHEN 5-325 MG PO TABS: ORAL_TABLET | ORAL | 0 refills | Status: DC

## 2021-01-09 ENCOUNTER — Other Ambulatory Visit: Payer: Self-pay | Admitting: Orthopaedic Surgery

## 2021-01-09 NOTE — Telephone Encounter (Signed)
Patient called at 3:55 left voicemail stating he wants his pain medicine refilled.  I tried to call the patient back and his voicemail is full unable to leave message.

## 2021-01-10 MED ORDER — HYDROCODONE-ACETAMINOPHEN 5-325 MG PO TABS: ORAL_TABLET | ORAL | 0 refills | Status: DC

## 2021-01-10 NOTE — Telephone Encounter (Signed)
Can you please advise on refill request in Dr Sanjuan Dame absence?  Thanks

## 2021-01-11 ENCOUNTER — Other Ambulatory Visit: Payer: Self-pay | Admitting: Internal Medicine

## 2021-01-11 DIAGNOSIS — F418 Other specified anxiety disorders: Secondary | ICD-10-CM

## 2021-01-16 ENCOUNTER — Telehealth: Payer: Self-pay | Admitting: Orthopaedic Surgery

## 2021-01-16 NOTE — Telephone Encounter (Signed)
Patient called this afternoon for refill / relayed that Dr Hilda Lias is out of clinic this week, and reviewed policy of requests to be received before noon on Thursdays:  HYDROcodone-acetaminophen (NORCO/VICODIN) 5-325 MG tablet 22 tablet              [facility], [address]

## 2021-01-16 NOTE — Telephone Encounter (Signed)
Done

## 2021-01-20 MED ORDER — HYDROCODONE-ACETAMINOPHEN 5-325 MG PO TABS: ORAL_TABLET | ORAL | 0 refills | Status: DC

## 2021-01-28 ENCOUNTER — Telehealth: Payer: Self-pay | Admitting: Orthopaedic Surgery

## 2021-01-28 MED ORDER — HYDROCODONE-ACETAMINOPHEN 5-325 MG PO TABS: ORAL_TABLET | ORAL | 0 refills | Status: DC

## 2021-01-28 NOTE — Telephone Encounter (Signed)
Patient of Dr. Sanjuan Dame  HYDROcodone-acetaminophen (NORCO/VICODIN) 5-325 MG tablet 22 tablet              [facility], [address]

## 2021-02-04 ENCOUNTER — Other Ambulatory Visit: Payer: Self-pay

## 2021-02-04 ENCOUNTER — Encounter: Payer: Self-pay | Admitting: Orthopaedic Surgery

## 2021-02-04 ENCOUNTER — Ambulatory Visit: Payer: Medicare Other | Admitting: Orthopaedic Surgery

## 2021-02-04 ENCOUNTER — Other Ambulatory Visit: Payer: Self-pay | Admitting: Internal Medicine

## 2021-02-04 VITALS — BP 131/90 | HR 69 | Ht 73.0 in | Wt 383.2 lb

## 2021-02-04 DIAGNOSIS — Z6841 Body Mass Index (BMI) 40.0 and over, adult: Secondary | ICD-10-CM | POA: Diagnosis not present

## 2021-02-04 DIAGNOSIS — M25511 Pain in right shoulder: Secondary | ICD-10-CM

## 2021-02-04 DIAGNOSIS — G8929 Other chronic pain: Secondary | ICD-10-CM

## 2021-02-04 DIAGNOSIS — F418 Other specified anxiety disorders: Secondary | ICD-10-CM

## 2021-02-04 MED ORDER — HYDROCODONE-ACETAMINOPHEN 5-325 MG PO TABS: ORAL_TABLET | ORAL | 0 refills | Status: DC

## 2021-02-04 NOTE — Progress Notes (Signed)
My shoulder and upper neck hurt  He has chronic right shoulder pain and right upper trapezius pain.  He has had some pain at the base of the neck more on the right.  He has no trauma,no swelling, no numbness, no redness.  He is taking his medicine.  Right shoulder has good ROM with pain in the extremes.  NV intact. ROM of neck is full.   Grips normal.  He has some tenderness at base of neck but no spasm.  Encounter Diagnoses  Name Primary?   Chronic right shoulder pain Yes   Body mass index 45.0-49.9, adult (HCC)    Morbid obesity (HCC)    I have recommended therapy.  He says he cannot do that now.  I have gone over exercises and use of heat and ice.  I have recommended massage if he can do that.  I have reviewed the [place] Controlled Substance Reporting System web site prior to prescribing narcotic medicine for this patient.  Return in one month.  Call if any problem.  Precautions discussed.  Electronically Signed Darreld Mclean, MD 8/30/20228:42 AM

## 2021-02-05 NOTE — Addendum Note (Signed)
Addended by: Abner Greenspan on: 02/05/2021 02:21 PM   Modules accepted: Orders, Level of Service, SmartSet

## 2021-02-12 ENCOUNTER — Telehealth: Payer: Self-pay | Admitting: Orthopaedic Surgery

## 2021-02-12 NOTE — Telephone Encounter (Signed)
Patient called back personally to request refill: HYDROcodone-acetaminophen (NORCO/VICODIN) 5-325 MG tablet 22 tablet    [facility], [address], [place]

## 2021-02-12 NOTE — Telephone Encounter (Signed)
Patient had called and left voice message yesterday, 02/11/21, and this morning, 02/12/21, requesting refill of pain medication. I have returned call, no voice mail available to leave message. I did speak with a male who is not listed as a designated contact on file. Awaiting patient to speak with [place] directly.

## 2021-02-13 MED ORDER — HYDROCODONE-ACETAMINOPHEN 5-325 MG PO TABS: ORAL_TABLET | ORAL | 0 refills | Status: DC

## 2021-02-19 ENCOUNTER — Telehealth: Payer: Self-pay | Admitting: Orthopaedic Surgery

## 2021-02-19 NOTE — Telephone Encounter (Signed)
Patient called back to request refill: HYDROcodone-acetaminophen (NORCO/VICODIN) 5-325 MG tablet 22 tablet    [facility], [address], [place]

## 2021-02-20 MED ORDER — HYDROCODONE-ACETAMINOPHEN 5-325 MG PO TABS: ORAL_TABLET | ORAL | 0 refills | Status: DC

## 2021-02-24 ENCOUNTER — Other Ambulatory Visit: Payer: Self-pay | Admitting: Internal Medicine

## 2021-02-24 DIAGNOSIS — F418 Other specified anxiety disorders: Secondary | ICD-10-CM

## 2021-02-26 ENCOUNTER — Telehealth: Payer: Self-pay | Admitting: Orthopaedic Surgery

## 2021-02-26 NOTE — Telephone Encounter (Signed)
Patient requests refill: HYDROcodone-acetaminophen (NORCO/VICODIN) 5-325 MG tablet 18 tablet    [facility] [address]* *Patient aware of appointment scheduled 03/04/21. York Spaniel will talk to Dr Hilda Lias at appointment "about him decreasing the dosage."

## 2021-02-26 NOTE — Telephone Encounter (Signed)
Done

## 2021-02-27 MED ORDER — HYDROCODONE-ACETAMINOPHEN 5-325 MG PO TABS: ORAL_TABLET | ORAL | 0 refills | Status: DC

## 2021-03-04 ENCOUNTER — Other Ambulatory Visit: Payer: Self-pay

## 2021-03-04 ENCOUNTER — Ambulatory Visit (INDEPENDENT_AMBULATORY_CARE_PROVIDER_SITE_OTHER): Payer: Medicare Other | Admitting: Orthopaedic Surgery

## 2021-03-04 ENCOUNTER — Encounter: Payer: Self-pay | Admitting: Orthopaedic Surgery

## 2021-03-04 VITALS — BP 158/108 | HR 78 | Ht 73.0 in | Wt 379.0 lb

## 2021-03-04 DIAGNOSIS — M542 Cervicalgia: Secondary | ICD-10-CM | POA: Diagnosis not present

## 2021-03-04 DIAGNOSIS — Z6841 Body Mass Index (BMI) 40.0 and over, adult: Secondary | ICD-10-CM | POA: Diagnosis not present

## 2021-03-04 DIAGNOSIS — M25511 Pain in right shoulder: Secondary | ICD-10-CM | POA: Diagnosis not present

## 2021-03-04 DIAGNOSIS — G8929 Other chronic pain: Secondary | ICD-10-CM

## 2021-03-04 MED ORDER — HYDROCODONE-ACETAMINOPHEN 5-325 MG PO TABS: ORAL_TABLET | ORAL | 0 refills | Status: DC

## 2021-03-04 NOTE — Progress Notes (Signed)
My neck hurts  He has pain in the mid neck area and also the right trapezius as well as the shoulder pain.  He has no new trauma.  He has no numbness or swelling.  He is taking his medicine.  Neck has full ROM but tender in the lower part, no spasm.  Right trapezius is tender, no spasm.  Shoulders have full ROM but more painful on the right.  Encounter Diagnoses  Name Primary?   Chronic right shoulder pain Yes   Neck pain    Body mass index 45.0-49.9, adult (HCC)    Morbid obesity (HCC)    Begin PT at [facility].  Return in one month.  I have renewed his pain medicine.  Call if any problem.  Precautions discussed.  Electronically Signed Darreld Mclean, MD 9/27/20228:57 AM

## 2021-03-12 ENCOUNTER — Telehealth: Payer: Self-pay | Admitting: Orthopaedic Surgery

## 2021-03-12 MED ORDER — HYDROCODONE-ACETAMINOPHEN 5-325 MG PO TABS: ORAL_TABLET | ORAL | 0 refills | Status: DC

## 2021-03-12 NOTE — Telephone Encounter (Signed)
Patient requests refill: HYDROcodone-acetaminophen (NORCO/VICODIN) 5-325 MG tablet 25 tablet    [facility], [address], [place] 

## 2021-03-19 ENCOUNTER — Telehealth: Payer: Self-pay | Admitting: Orthopaedic Surgery

## 2021-03-19 NOTE — Telephone Encounter (Signed)
Patient called requesting refill for his pain medicine.  ° °HYDROcodone-acetaminophen (NORCO/VICODIN) 5-325 MG tablet ° °Pharmacy: [facility] [address]  ° °

## 2021-03-20 ENCOUNTER — Other Ambulatory Visit: Payer: Self-pay | Admitting: Family Medicine

## 2021-03-20 DIAGNOSIS — F418 Other specified anxiety disorders: Secondary | ICD-10-CM

## 2021-03-20 MED ORDER — HYDROCODONE-ACETAMINOPHEN 5-325 MG PO TABS: ORAL_TABLET | ORAL | 0 refills | Status: DC

## 2021-03-26 ENCOUNTER — Telehealth: Payer: Self-pay | Admitting: Orthopaedic Surgery

## 2021-03-26 NOTE — Telephone Encounter (Signed)
Patient requests refill HYDROcodone-acetaminophen (NORCO/VICODIN) 5-325 MG tablet 20 tablet     [facility], [address] [place]

## 2021-03-27 MED ORDER — HYDROCODONE-ACETAMINOPHEN 5-325 MG PO TABS: ORAL_TABLET | ORAL | 0 refills | Status: DC

## 2021-04-02 ENCOUNTER — Other Ambulatory Visit: Payer: Self-pay | Admitting: Orthopaedic Surgery

## 2021-04-02 NOTE — Telephone Encounter (Signed)
Patient called requesting refill for his pain medicine.  ° °HYDROcodone-acetaminophen (NORCO/VICODIN) 5-325 MG tablet ° °Pharmacy: [facility] [address]  ° °

## 2021-04-03 MED ORDER — HYDROCODONE-ACETAMINOPHEN 5-325 MG PO TABS: ORAL_TABLET | ORAL | 0 refills | Status: DC

## 2021-04-10 ENCOUNTER — Telehealth: Payer: Self-pay | Admitting: Orthopaedic Surgery

## 2021-04-10 ENCOUNTER — Other Ambulatory Visit: Payer: Self-pay | Admitting: Internal Medicine

## 2021-04-10 DIAGNOSIS — F418 Other specified anxiety disorders: Secondary | ICD-10-CM

## 2021-04-10 MED ORDER — HYDROCODONE-ACETAMINOPHEN 5-325 MG PO TABS: ORAL_TABLET | ORAL | 0 refills | Status: DC

## 2021-04-10 NOTE — Telephone Encounter (Signed)
Patient called requesting refill for his pain medicine.  ° °HYDROcodone-acetaminophen (NORCO/VICODIN) 5-325 MG tablet ° °Pharmacy: [facility] [address]  ° °

## 2021-04-15 ENCOUNTER — Ambulatory Visit: Payer: Medicare Other | Admitting: Orthopaedic Surgery

## 2021-04-16 ENCOUNTER — Ambulatory Visit: Payer: Medicare Other | Admitting: Gastroenterology

## 2021-04-16 ENCOUNTER — Encounter: Payer: Self-pay | Admitting: Internal Medicine

## 2021-04-17 ENCOUNTER — Telehealth: Payer: Self-pay | Admitting: Orthopaedic Surgery

## 2021-04-17 MED ORDER — HYDROCODONE-ACETAMINOPHEN 5-325 MG PO TABS: ORAL_TABLET | ORAL | 0 refills | Status: DC

## 2021-04-17 NOTE — Telephone Encounter (Signed)
Call from patient - requests refill: HYDROcodone-acetaminophen (NORCO/VICODIN) 5-325 MG tablet 16 tablet      [facility] on [address] [place]

## 2021-04-28 ENCOUNTER — Telehealth: Payer: Self-pay | Admitting: Orthopaedic Surgery

## 2021-04-28 NOTE — Telephone Encounter (Signed)
Patient called requesting refill for his pain medicine.  ° °HYDROcodone-acetaminophen (NORCO/VICODIN) 5-325 MG tablet ° °Pharmacy  [facility] [address]  °

## 2021-04-29 ENCOUNTER — Ambulatory Visit: Payer: Medicare Other

## 2021-04-29 ENCOUNTER — Other Ambulatory Visit: Payer: Self-pay

## 2021-04-29 ENCOUNTER — Encounter: Payer: Self-pay | Admitting: Orthopaedic Surgery

## 2021-04-29 ENCOUNTER — Ambulatory Visit (INDEPENDENT_AMBULATORY_CARE_PROVIDER_SITE_OTHER): Payer: Medicare Other | Admitting: Orthopaedic Surgery

## 2021-04-29 ENCOUNTER — Ambulatory Visit: Payer: Medicare Other | Admitting: Orthopaedic Surgery

## 2021-04-29 VITALS — BP 186/101 | HR 69 | Ht 74.0 in | Wt 388.4 lb

## 2021-04-29 DIAGNOSIS — M542 Cervicalgia: Secondary | ICD-10-CM

## 2021-04-29 DIAGNOSIS — Z6841 Body Mass Index (BMI) 40.0 and over, adult: Secondary | ICD-10-CM

## 2021-04-29 DIAGNOSIS — M25511 Pain in right shoulder: Secondary | ICD-10-CM | POA: Diagnosis not present

## 2021-04-29 DIAGNOSIS — G8929 Other chronic pain: Secondary | ICD-10-CM

## 2021-04-29 MED ORDER — HYDROCODONE-ACETAMINOPHEN 5-325 MG PO TABS: ORAL_TABLET | ORAL | 0 refills | Status: DC

## 2021-04-29 NOTE — Addendum Note (Signed)
Addended byCaffie Damme on: 04/29/2021 10:01 AM   Modules accepted: Orders

## 2021-04-29 NOTE — Progress Notes (Signed)
My neck and shoulder hurt.  He has developed pain in the right upper trapezius.  He has no trauma.  He did not make his PT as his mother got COVID and he had to take care of her.  He is taking his medicine.  He has pain that goes to the right upper arm.  ROM of the right shoulder is good with pain in the extremes.  His neck motion is full.  NV intact.  Right upper trapezius is tender.  Encounter Diagnoses  Name Primary?   Neck pain Yes   Chronic right shoulder pain    Body mass index 45.0-49.9, adult (HCC)    Morbid obesity (HCC)    X-rays were done of the cervical spine, reported separately.  I will reschedule PT.  Return in three weeks.  I have reviewed the [place] Controlled Substance Reporting System web site prior to prescribing narcotic medicine for this patient.  Call if any problem.  Precautions discussed.  Electronically Signed Darreld Mclean, MD 11/22/20229:59 AM

## 2021-05-01 ENCOUNTER — Other Ambulatory Visit: Payer: Self-pay | Admitting: Internal Medicine

## 2021-05-01 DIAGNOSIS — F418 Other specified anxiety disorders: Secondary | ICD-10-CM

## 2021-05-02 ENCOUNTER — Other Ambulatory Visit: Payer: Self-pay | Admitting: Internal Medicine

## 2021-05-02 ENCOUNTER — Telehealth: Payer: Self-pay | Admitting: Internal Medicine

## 2021-05-02 ENCOUNTER — Other Ambulatory Visit: Payer: Self-pay

## 2021-05-02 DIAGNOSIS — F418 Other specified anxiety disorders: Secondary | ICD-10-CM

## 2021-05-02 MED ORDER — ALBUTEROL SULFATE HFA 108 (90 BASE) MCG/ACT IN AERS: 2.0000 | INHALATION_SPRAY | Freq: Four times a day (QID) | RESPIRATORY_TRACT | 0 refills | Status: DC | PRN

## 2021-05-02 NOTE — Telephone Encounter (Signed)
1 refill ONLY- needs appt before further refill

## 2021-05-02 NOTE — Telephone Encounter (Signed)
Pt called in for refill on inhaler  albuterol (VENTOLIN HFA) 108 (90 Base) MCG/ACT inhaler   Pt states the pharm denied refill  And pt has no pumps In current inhaler

## 2021-05-05 ENCOUNTER — Telehealth: Payer: Self-pay | Admitting: Radiology

## 2021-05-05 NOTE — Telephone Encounter (Signed)
Patient called, requests refill hydrocodone.  [facility] [address]

## 2021-05-06 ENCOUNTER — Encounter: Payer: Self-pay | Admitting: Internal Medicine

## 2021-05-06 ENCOUNTER — Ambulatory Visit (INDEPENDENT_AMBULATORY_CARE_PROVIDER_SITE_OTHER): Payer: Medicare Other | Admitting: Internal Medicine

## 2021-05-06 ENCOUNTER — Other Ambulatory Visit: Payer: Self-pay

## 2021-05-06 DIAGNOSIS — J4541 Moderate persistent asthma with (acute) exacerbation: Secondary | ICD-10-CM

## 2021-05-06 DIAGNOSIS — F418 Other specified anxiety disorders: Secondary | ICD-10-CM | POA: Diagnosis not present

## 2021-05-06 MED ORDER — FLUTICASONE PROPIONATE HFA 110 MCG/ACT IN AERO: 2.0000 | INHALATION_SPRAY | Freq: Two times a day (BID) | RESPIRATORY_TRACT | 2 refills | Status: DC

## 2021-05-06 MED ORDER — HYDROCODONE-ACETAMINOPHEN 5-325 MG PO TABS: ORAL_TABLET | ORAL | 0 refills | Status: DC

## 2021-05-06 MED ORDER — ALBUTEROL SULFATE HFA 108 (90 BASE) MCG/ACT IN AERS: 2.0000 | INHALATION_SPRAY | Freq: Four times a day (QID) | RESPIRATORY_TRACT | 5 refills | Status: DC | PRN

## 2021-05-06 NOTE — Patient Instructions (Signed)
Please start using Flovent regularly.  Please use albuterol inhaler only as needed for shortness of breath/wheezing related to asthma.

## 2021-05-06 NOTE — Assessment & Plan Note (Signed)
Needs to use Flovent regularly Albuterol as needed for dyspnea or wheezing only Advised to use it only for asthma related dyspnea and not for anxiety/panic episode, needs to follow-up with psychiatry if he has uncontrolled anxiety

## 2021-05-06 NOTE — Progress Notes (Signed)
Virtual Visit via Telephone Note   This visit type was conducted due to national recommendations for restrictions regarding the COVID-19 Pandemic (e.g. social distancing) in an effort to limit this patient's exposure and mitigate transmission in our community.  Due to his co-morbid illnesses, this patient is at least at moderate risk for complications without adequate follow up.  This format is felt to be most appropriate for this patient at this time.  The patient did not have access to video technology/had technical difficulties with video requiring transitioning to audio format only (telephone).  All issues noted in this document were discussed and addressed.  No physical exam could be performed with this format.  Evaluation Performed:  Follow-up visit  Date:  05/06/2021   ID:  Martin Bruce, DOB 1976/02/26, MRN [id number]  Patient Location: Home Provider Location: Office/Clinic  Participants: Patient Location of Patient: Home Location of Provider: Telehealth Consent was obtain for visit to be over via telehealth. I verified that I am speaking with the correct person using two identifiers.  PCP:  Anabel Halon, MD   Chief Complaint: Asthma f/u  History of Present Illness:    Martin Bruce is a 45 y.o. male who has a televisit for follow-up of asthma.  He had missed previous office visits for follow-up.  He has been using albuterol inhaler 4-6 times in a day recently.  He denies any fever, chills or hemoptysis currently.  He reports that he uses albuterol inhaler for dyspnea when he has anxiety/panic episode.  He also has run out of his Flovent.  The patient does not have symptoms concerning for COVID-19 infection (fever, chills, cough, or new shortness of breath).   Past Medical, Surgical, Social History, Allergies, and Medications have been Reviewed.  Past Medical History:  Diagnosis Date   Anxiety    Asthma    Gout    Past Surgical History:  Procedure Laterality  Date   CARPAL TUNNEL RELEASE       Current Meds  Medication Sig   albuterol (PROVENTIL) (2.5 MG/3ML) 0.083% nebulizer solution Take 2.5 mg by nebulization every 6 (six) hours as needed for wheezing or shortness of breath.   alprazolam (XANAX) 2 MG tablet Take 2 mg by mouth 3 (three) times daily.   cyclobenzaprine (FLEXERIL) 10 MG tablet Take by mouth.   FLUoxetine (PROZAC) 20 MG capsule Take 20 mg by mouth daily.   HYDROcodone-acetaminophen (NORCO/VICODIN) 5-325 MG tablet One tablet every six hours for pain.  Limit 7 days.   hydrOXYzine (VISTARIL) 50 MG capsule Take 100 mg by mouth every 8 (eight) hours as needed.   naproxen (NAPROSYN) 500 MG tablet Take 1 tablet (500 mg total) by mouth 2 (two) times daily with a meal.   ondansetron (ZOFRAN ODT) 4 MG disintegrating tablet Take 1 tablet (4 mg total) by mouth every 8 (eight) hours as needed for nausea or vomiting.   pantoprazole (PROTONIX) 20 MG tablet Take 1 tablet (20 mg total) by mouth daily.   sucralfate (CARAFATE) 1 g tablet Take 1 tablet (1 g total) by mouth 2 (two) times daily.   triamcinolone (KENALOG) 0.1 % Apply topically.   ziprasidone (GEODON) 80 MG capsule Take 80 mg by mouth at bedtime.   [DISCONTINUED] albuterol (VENTOLIN HFA) 108 (90 Base) MCG/ACT inhaler Inhale 2 puffs into the lungs every 6 (six) hours as needed for wheezing or shortness of breath.   [DISCONTINUED] fluticasone (FLOVENT HFA) 110 MCG/ACT inhaler Inhale into the lungs.  Allergies:   Penicillins   ROS:   Please see the history of present illness.     All other systems reviewed and are negative.   Labs/Other Tests and Data Reviewed:    Recent Labs: 10/19/2020: ALT 30; BUN 12; Creatinine, Ser 0.94; Hemoglobin 14.3; Platelets 268; Potassium 3.8; Sodium 136   Recent Lipid Panel No results found for: CHOL, TRIG, HDL, CHOLHDL, LDLCALC, LDLDIRECT  Wt Readings from Last 3 Encounters:  04/29/21 (!) 388 lb 6.4 oz (176.2 kg)  03/04/21 (!) 379 lb (171.9 kg)   02/04/21 (!) 383 lb 3.2 oz (173.8 kg)     ASSESSMENT & PLAN:    Moderate persistent asthma with exacerbation Needs to use Flovent regularly Albuterol as needed for dyspnea or wheezing only Advised to use it only for asthma related dyspnea and not for anxiety/panic episode, needs to follow-up with psychiatry if he has uncontrolled anxiety  Anxiety with depression On Prozac Xanax 2 mg TID Follow[place] up [place] Psychiatrist in [place]   Time:   Today, I have spent 15 minutes reviewing the chart, including problem list, medications, and with the patient with telehealth technology discussing the above problems.   Medication Adjustments/Labs and Tests Ordered: Current medicines are reviewed at length with the patient today.  Concerns regarding medicines are outlined above.   Tests Ordered: No orders of the defined types were placed in this encounter.   Medication Changes: Meds ordered this encounter  Medications   albuterol (VENTOLIN HFA) 108 (90 Base) MCG/ACT inhaler    Sig: Inhale 2 puffs into the lungs every 6 (six) hours as needed for wheezing or shortness of breath.    Dispense:  8.5 g    Refill:  5   fluticasone (FLOVENT HFA) 110 MCG/ACT inhaler    Sig: Inhale 2 puffs into the lungs 2 (two) times daily.    Dispense:  1 each    Refill:  2     Note: This dictation was prepared with Dragon dictation along with smaller phrase technology. Similar sounding words can be transcribed inadequately or may not be corrected upon review. Any transcriptional errors that result from this process are unintentional.      Disposition:  Follow up  Signed, Anabel Halon, MD  05/06/2021 12:03 PM     [facility]

## 2021-05-06 NOTE — Assessment & Plan Note (Signed)
On Prozac Xanax 2 mg TID Follows up with Psychiatrist in [place] 

## 2021-05-12 ENCOUNTER — Telehealth: Payer: Self-pay | Admitting: Orthopaedic Surgery

## 2021-05-12 MED ORDER — HYDROCODONE-ACETAMINOPHEN 5-325 MG PO TABS: ORAL_TABLET | ORAL | 0 refills | Status: DC

## 2021-05-12 NOTE — Telephone Encounter (Signed)
Patient called for refill: HYDROcodone-acetaminophen (NORCO/VICODIN) 5-325 MG tablet 22 tablet      [facility], [address] [place]

## 2021-05-20 ENCOUNTER — Other Ambulatory Visit: Payer: Self-pay

## 2021-05-20 ENCOUNTER — Ambulatory Visit: Payer: Medicare Other | Admitting: Orthopaedic Surgery

## 2021-05-20 ENCOUNTER — Encounter: Payer: Self-pay | Admitting: Orthopaedic Surgery

## 2021-05-20 VITALS — Ht 74.0 in | Wt 387.0 lb

## 2021-05-20 DIAGNOSIS — M25511 Pain in right shoulder: Secondary | ICD-10-CM

## 2021-05-20 DIAGNOSIS — M542 Cervicalgia: Secondary | ICD-10-CM

## 2021-05-20 DIAGNOSIS — Z6841 Body Mass Index (BMI) 40.0 and over, adult: Secondary | ICD-10-CM | POA: Diagnosis not present

## 2021-05-20 DIAGNOSIS — G8929 Other chronic pain: Secondary | ICD-10-CM

## 2021-05-20 MED ORDER — HYDROCODONE-ACETAMINOPHEN 5-325 MG PO TABS: ORAL_TABLET | ORAL | 0 refills | Status: DC

## 2021-05-20 NOTE — Patient Instructions (Signed)
For elbow pain Aspercreme, Biofreeze, Blue Emu or Voltaren Gel over the counter 2-3 times daily. Rub into area well each use for best results.  Try an elbow pad like skateboarders use if you need it for elbow pain.   F/up 1 month

## 2021-05-20 NOTE — Progress Notes (Signed)
My shoulder still hurts.  He has been to PT in [place] and has several more appointments to make.  He is a little better doing the exercises.  He has no new trauma, no numbness.  He has some pain of the right olecranon area with no redness or swelling. ROM is full.  ROM of the right shoulder is full but pain in the extremes.   NV intact.  Encounter Diagnoses  Name Primary?   Neck pain Yes   Chronic right shoulder pain    Body mass index 45.0-49.9, adult (HCC)    Morbid obesity (HCC)    Continue PT.  Return in one month.  I have reviewed the [place] Controlled Substance Reporting System web site prior to prescribing narcotic medicine for this patient.  Call if any problem.  Precautions discussed.  Electronically Signed Darreld Mclean, MD 12/13/20229:18 AM

## 2021-05-27 ENCOUNTER — Telehealth: Payer: Self-pay | Admitting: Orthopaedic Surgery

## 2021-05-27 NOTE — Telephone Encounter (Signed)
Patient called requesting refill for his pain medicine.   HYDROcodone-acetaminophen (NORCO/VICODIN) 5-325 MG tablet  Pharmacy  [facility] on [address]

## 2021-05-28 MED ORDER — HYDROCODONE-ACETAMINOPHEN 5-325 MG PO TABS: ORAL_TABLET | ORAL | 0 refills | Status: DC

## 2021-06-04 ENCOUNTER — Telehealth: Payer: Self-pay | Admitting: Orthopaedic Surgery

## 2021-06-04 MED ORDER — HYDROCODONE-ACETAMINOPHEN 5-325 MG PO TABS: ORAL_TABLET | ORAL | 0 refills | Status: DC

## 2021-06-04 NOTE — Telephone Encounter (Signed)
Patient called for refill / aware while Dr Hilda Lias is out of clinic, our other provider(s) are reviewing refill requests  - HYDROcodone-acetaminophen (NORCO/VICODIN) 5-325 MG tablet 18 tablet     [facility], [address]

## 2021-06-11 ENCOUNTER — Telehealth: Payer: Self-pay

## 2021-06-11 MED ORDER — HYDROCODONE-ACETAMINOPHEN 5-325 MG PO TABS: ORAL_TABLET | ORAL | 0 refills | Status: DC

## 2021-06-11 NOTE — Telephone Encounter (Signed)
Hydrocodone-Acetaminophen 5/325 mg  Qty 18 Tablets  PATIENT USES [facility] ON [address]

## 2021-06-17 ENCOUNTER — Ambulatory Visit: Payer: Medicare Other | Admitting: Orthopaedic Surgery

## 2021-06-17 ENCOUNTER — Encounter: Payer: Self-pay | Admitting: Nurse Practitioner

## 2021-06-17 ENCOUNTER — Telehealth: Payer: Self-pay

## 2021-06-17 ENCOUNTER — Encounter: Payer: Self-pay | Admitting: Orthopaedic Surgery

## 2021-06-17 ENCOUNTER — Ambulatory Visit (INDEPENDENT_AMBULATORY_CARE_PROVIDER_SITE_OTHER): Payer: Medicare Other | Admitting: Nurse Practitioner

## 2021-06-17 ENCOUNTER — Other Ambulatory Visit: Payer: Self-pay

## 2021-06-17 VITALS — BP 133/93 | HR 66 | Temp 98.3°F | Ht 74.0 in | Wt 391.0 lb

## 2021-06-17 VITALS — BP 156/107 | HR 60 | Ht 74.0 in | Wt 387.0 lb

## 2021-06-17 DIAGNOSIS — M2550 Pain in unspecified joint: Secondary | ICD-10-CM

## 2021-06-17 DIAGNOSIS — H5712 Ocular pain, left eye: Secondary | ICD-10-CM

## 2021-06-17 MED ORDER — HYDROCODONE-ACETAMINOPHEN 5-325 MG PO TABS: ORAL_TABLET | ORAL | 0 refills | Status: DC

## 2021-06-17 MED ORDER — PREDNISONE 5 MG (21) PO TBPK: ORAL_TABLET | ORAL | 0 refills | Status: DC

## 2021-06-17 MED ORDER — OFLOXACIN 0.3 % OP SOLN: 1.0000 [drp] | Freq: Four times a day (QID) | OPHTHALMIC | 0 refills | Status: DC

## 2021-06-17 NOTE — Progress Notes (Signed)
My shoulder is worse.  My foot hurts too.  My mom has gout and I might also.  His right shoulder is more painful.  He has no new trauma.  He has pain with overhead use.  I have talked about OT again but he will go to massage treatment.    His left foot has tenderness of the great toe MTP but no redness.  NV intact.  I am concerned about gout.  Encounter Diagnosis  Name Primary?   Pain in joint, multiple sites Yes   I will get serum uric level.  I will give pain medicine.  I have reviewed the [place] web site prior to prescribing narcotic medicine for this patient.  I will give prednisone dose pack.  Return in one week.  Call if any problem.  Precautions discussed.  Electronically Signed Sanjuana Kava, MD 1/10/20239:59 AM

## 2021-06-17 NOTE — Telephone Encounter (Signed)
Talked with mother and she will get in touch with him tonight - appt 7:50 am tomorrow

## 2021-06-17 NOTE — Progress Notes (Signed)
° °  Martin Bruce     MRN: [id number]      DOB: 25-Oct-1975   HPI Martin Bruce is here for complaints of left eye pain, pulsating pain 10/10, has watery eye dicsharge  since last night, denies change in vision, eyes have been matting up. He has never had this type of pain before, denies fever, chills, denies problems with the right eye. He has been sneezing since this morning,denies  cough, HA , CP , SOB, , wheezing, ear pain, nausea , vomiting, trauma.    ROS Denies recent fever or chills. Denies sinus pressure, nasal congestion, ear pain or sore throat., has left eye pain Denies chest congestion, productive cough or wheezing. Denies chest pains, palpitations and leg swelling Denies abdominal pain, nausea, vomiting,diarrhea or constipation.   Denies dysuria, frequency, hesitancy or incontinence. Denies joint pain, swelling and limitation in mobility. Denies headaches, seizures, numbness, or tingling. Denies depression, anxiety or insomnia. Denies skin break down or rash.   PE  BP (!) 133/93 (BP Location: Right Arm, Patient Position: Sitting, Cuff Size: Large)    Pulse 66    Temp 98.3 F (36.8 C) (Oral)    Ht 6\' 2"  (1.88 m)    Wt (!) 391 lb (177.4 kg)    SpO2 96%    BMI 50.20 kg/m   Patient alert and oriented and in no cardiopulmonary distress.  HEENT: No facial asymmetry,     Neck supple ., left eye sclera red on examination, PERRLA  Chest: Clear to auscultation bilaterally.  CVS: S1, S2 no murmurs, no S3.Regular rate.  ABD: Soft non tender.   Ext: No edema  MS: Adequate ROM spine, shoulders, hips and knees.  Skin: Intact, no ulcerations or rash noted.  Psych: Good eye contact, normal affect. Memory intact not anxious or depressed appearing.  CNS: CN 2-12 intact, power,  normal throughout.no focal deficits noted.   Assessment & Plan

## 2021-06-17 NOTE — Patient Instructions (Addendum)
Take ofloxacin eye drop as directed , one drop in the left eye 4 times a day, you have been referred to the opthamologist.    It is important that you exercise regularly at least 30 minutes 5 times a week.  Think about what you will eat, plan ahead. Choose " clean, green, fresh or frozen" over canned, processed or packaged foods which are more sugary, salty and fatty. 70 to 75% of food eaten should be vegetables and fruit. Three meals at set times with snacks allowed between meals, but they must be fruit or vegetables. Aim to eat over a 12 hour period , example 7 am to 7 pm, and STOP after  your last meal of the day. Drink water,generally about 64 ounces per day, no other drink is as healthy. Fruit juice is best enjoyed in a healthy way, by EATING the fruit.  Thanks for choosing [facility], we consider it a privelige to serve you.

## 2021-06-17 NOTE — Assessment & Plan Note (Signed)
Ofloxacin eye drop , one drop four times daily. Urgent referral made to opthalmology.

## 2021-06-24 ENCOUNTER — Other Ambulatory Visit: Payer: Self-pay

## 2021-06-24 ENCOUNTER — Encounter: Payer: Self-pay | Admitting: Orthopaedic Surgery

## 2021-06-24 ENCOUNTER — Encounter: Payer: Medicare Other | Admitting: Orthopaedic Surgery

## 2021-06-24 ENCOUNTER — Telehealth: Payer: Self-pay | Admitting: Radiology

## 2021-06-24 LAB — URIC ACID: Uric Acid, Serum: 8.2 mg/dL — ABNORMAL HIGH (ref 4.0–8.0)

## 2021-06-24 MED ORDER — HYDROCODONE-ACETAMINOPHEN 5-325 MG PO TABS: ORAL_TABLET | ORAL | 0 refills | Status: DC

## 2021-06-24 NOTE — Telephone Encounter (Signed)
Patient came in for appointment but did not go for labs as directed He will go today his appointment was RS to Thursday  He is requesting hydrocodone refill to [facility] [address]

## 2021-06-24 NOTE — Patient Instructions (Signed)
RS to Thursday after labs

## 2021-06-25 ENCOUNTER — Telehealth: Payer: Self-pay

## 2021-06-25 MED ORDER — ALLOPURINOL 300 MG PO TABS: 300.0000 mg | ORAL_TABLET | Freq: Every day | ORAL | 5 refills | Status: DC

## 2021-06-25 NOTE — Telephone Encounter (Signed)
Request for Allopurinol    PATIENT Martin Bruce

## 2021-06-26 ENCOUNTER — Other Ambulatory Visit: Payer: Self-pay

## 2021-06-26 ENCOUNTER — Encounter: Payer: Self-pay | Admitting: Orthopaedic Surgery

## 2021-06-26 ENCOUNTER — Ambulatory Visit: Payer: Medicare Other | Admitting: Orthopaedic Surgery

## 2021-06-26 VITALS — BP 129/97 | HR 90 | Ht 74.0 in | Wt 388.0 lb

## 2021-06-26 DIAGNOSIS — M1A09X0 Idiopathic chronic gout, multiple sites, without tophus (tophi): Secondary | ICD-10-CM | POA: Diagnosis not present

## 2021-06-26 NOTE — Progress Notes (Signed)
I got those labs  He had serum uric acid done and it is high at 8.2.  I have explained what gout is, that he inherited it and that he needs to take allopurinol daily.  He appears to understand.  He has shoulder pain on the left.  ROM is good with pain in the extremes.  NV intact.  Encounter Diagnosis  Name Primary?   Idiopathic chronic gout of multiple sites without tophus Yes   I have called in the medicine allopurinol.  Return in one month.  Call if any problem.  Precautions discussed.  Electronically Signed Darreld Mclean, MD 1/19/20238:59 AM

## 2021-07-02 ENCOUNTER — Telehealth: Payer: Self-pay | Admitting: Orthopaedic Surgery

## 2021-07-02 NOTE — Telephone Encounter (Signed)
Patient called for refill:  HYDROcodone-acetaminophen (NORCO/VICODIN) 5-325 MG tablet 16 tablet      [facility], [address], [place] 

## 2021-07-03 MED ORDER — HYDROCODONE-ACETAMINOPHEN 5-325 MG PO TABS: ORAL_TABLET | ORAL | 0 refills | Status: DC

## 2021-07-08 ENCOUNTER — Telehealth: Payer: Self-pay | Admitting: Orthopaedic Surgery

## 2021-07-08 MED ORDER — HYDROCODONE-ACETAMINOPHEN 5-325 MG PO TABS: ORAL_TABLET | ORAL | 0 refills | Status: DC

## 2021-07-08 NOTE — Telephone Encounter (Signed)
Patient requests refill HYDROcodone-acetaminophen (NORCO/VICODIN) 5-325 MG tablet 16 tablet        [facility], [address] [place]

## 2021-07-16 ENCOUNTER — Telehealth: Payer: Self-pay

## 2021-07-16 MED ORDER — HYDROCODONE-ACETAMINOPHEN 5-325 MG PO TABS: ORAL_TABLET | ORAL | 0 refills | Status: DC

## 2021-07-16 NOTE — Telephone Encounter (Signed)
Hydrocodone-Acetaminophen 5/325 mg  Qty 16 Tablets  PATIENT USES [facility] [address]

## 2021-07-22 ENCOUNTER — Telehealth: Payer: Self-pay | Admitting: Orthopaedic Surgery

## 2021-07-22 ENCOUNTER — Telehealth: Payer: Self-pay | Admitting: Radiology

## 2021-07-22 MED ORDER — HYDROCODONE-ACETAMINOPHEN 5-325 MG PO TABS: ORAL_TABLET | ORAL | 0 refills | Status: DC

## 2021-07-22 MED ORDER — PREDNISONE 5 MG (21) PO TBPK: ORAL_TABLET | ORAL | 0 refills | Status: DC

## 2021-07-22 NOTE — Telephone Encounter (Signed)
Patient called and wants [place] to call the pharmacy to fill the prescription early  He states it is the [facility] in [place] on [address]. He has advised he can not wait 7 days  Ok to fill early?

## 2021-07-22 NOTE — Telephone Encounter (Signed)
Patient called to request refill if possible  - medication 32  - patient is aware of appointment scheduled 07/30/21  predniSONE (STERAPRED UNI-PAK 21 TAB) 5 MG (21) TBPK tablet 21 tablet      - also asking for change of pharmacy: [facility], [address], [place], [place]

## 2021-07-22 NOTE — Telephone Encounter (Signed)
Patient requests refill for medication #1  HYDROcodone-acetaminophen (NORCO/VICODIN) 5-325 MG tablet 16 tablet     - Requests to be sent to a different pharmacy:  [facility], [address], [place], [place]

## 2021-07-24 ENCOUNTER — Ambulatory Visit (INDEPENDENT_AMBULATORY_CARE_PROVIDER_SITE_OTHER): Payer: Medicare Other | Admitting: Orthopaedic Surgery

## 2021-07-24 ENCOUNTER — Other Ambulatory Visit: Payer: Self-pay

## 2021-07-24 ENCOUNTER — Encounter: Payer: Self-pay | Admitting: Orthopaedic Surgery

## 2021-07-24 VITALS — BP 169/109 | HR 61

## 2021-07-24 DIAGNOSIS — M2550 Pain in unspecified joint: Secondary | ICD-10-CM

## 2021-07-24 DIAGNOSIS — M1A09X0 Idiopathic chronic gout, multiple sites, without tophus (tophi): Secondary | ICD-10-CM | POA: Diagnosis not present

## 2021-07-24 NOTE — Progress Notes (Signed)
My gout is better today.  He had a recent attack of gout affecting the right foot.  He took his allopurinol and I called in prednisone.  He is walking better.  He plans to go to [place] next weekend and I will call in medicine for that trip.  His right foot is tender but not swollen and not red.  NV intact.  Encounter Diagnoses  Name Primary?   Idiopathic chronic gout of multiple sites without tophus Yes   Pain in joint, multiple sites    Return in three weeks.  Call if any problem.  Precautions discussed.  Electronically Signed Darreld Mclean, MD 2/16/20239:21 AM

## 2021-07-29 ENCOUNTER — Telehealth: Payer: Self-pay | Admitting: Orthopaedic Surgery

## 2021-07-29 MED ORDER — PREDNISONE 5 MG (21) PO TBPK: ORAL_TABLET | ORAL | 0 refills | Status: DC

## 2021-07-29 MED ORDER — HYDROCODONE-ACETAMINOPHEN 7.5-325 MG PO TABS
1.0000 | ORAL_TABLET | Freq: Four times a day (QID) | ORAL | 0 refills | Status: DC | PRN
Start: 2021-07-29 — End: 2021-08-07

## 2021-07-29 NOTE — Telephone Encounter (Signed)
Patient called for refill - requesting as follows: HYDROcodone-acetaminophen (NORCO/VICODIN)  16 tablet   - patient states Dr Hilda Lias was going to increase his medication from 5-325. Please advise.  - patient requests [facility], [address], [place]

## 2021-07-29 NOTE — Telephone Encounter (Signed)
Patient requests second medication refill: predniSONE (STERAPRED UNI-PAK 21 TAB) 5 MG (21) TBPK tablet 21 tablet       - requests [facility], [address], if approved

## 2021-08-06 ENCOUNTER — Telehealth: Payer: Self-pay | Admitting: Radiology

## 2021-08-06 NOTE — Telephone Encounter (Signed)
Patient requesting refill of both prednisone pack and hydrocodone.  Prednisone and hydrocodone both last prescribed on 07/29/21 by Dr Hilda Lias.  ? ?[facility]  ?

## 2021-08-07 ENCOUNTER — Other Ambulatory Visit: Payer: Self-pay | Admitting: Orthopedic Surgery

## 2021-08-07 MED ORDER — HYDROCODONE-ACETAMINOPHEN 7.5-325 MG PO TABS: 1.0000 | ORAL_TABLET | Freq: Four times a day (QID) | ORAL | 0 refills | Status: DC | PRN

## 2021-08-07 MED ORDER — PREDNISONE 5 MG (21) PO TBPK: ORAL_TABLET | ORAL | 0 refills | Status: DC

## 2021-08-07 NOTE — Progress Notes (Signed)
Meds ordered this encounter  ?Medications  ? predniSONE (STERAPRED UNI-PAK 21 TAB) 5 MG (21) TBPK tablet  ?  Sig: Take 6 pills first day; 5 pills second day; 4 pills third day; 3 pills fourth day; 2 pills next day and 1 pill last day.  ?  Dispense:  21 tablet  ?  Refill:  0  ? HYDROcodone-acetaminophen (NORCO) 7.5-325 MG tablet  ?  Sig: Take 1 tablet by mouth every 6 (six) hours as needed for up to 7 days. One every six hours as needed for pain.  Seven day limit  ?  Dispense:  28 tablet  ?  Refill:  0  ? ? ?

## 2021-08-11 ENCOUNTER — Telehealth: Payer: Self-pay | Admitting: Orthopaedic Surgery

## 2021-08-11 NOTE — Telephone Encounter (Signed)
Patient requests refill ?HYDROcodone-acetaminophen (NORCO) 7.5-325 MG tablet 28 tablet  ? Pharmacy: ?

## 2021-08-12 MED ORDER — HYDROCODONE-ACETAMINOPHEN 7.5-325 MG PO TABS: 1.0000 | ORAL_TABLET | Freq: Four times a day (QID) | ORAL | 0 refills | Status: AC | PRN

## 2021-08-12 NOTE — Telephone Encounter (Signed)
(  Continued)-patient called back with pharmacy: ?Patient requests refill ?HYDROcodone-acetaminophen (NORCO) 7.5-325 MG tablet 28 tablet  ? Pharmacy:[facility] [address], [place] ? ?

## 2021-08-14 ENCOUNTER — Other Ambulatory Visit: Payer: Self-pay

## 2021-08-14 ENCOUNTER — Encounter: Payer: Self-pay | Admitting: Orthopaedic Surgery

## 2021-08-14 ENCOUNTER — Ambulatory Visit: Payer: Medicare Other | Admitting: Orthopaedic Surgery

## 2021-08-14 VITALS — BP 178/104 | HR 67 | Ht 73.0 in | Wt 388.6 lb

## 2021-08-14 DIAGNOSIS — M1A0710 Idiopathic chronic gout, right ankle and foot, without tophus (tophi): Secondary | ICD-10-CM

## 2021-08-14 NOTE — Progress Notes (Signed)
My foot is still hurting. ? ?He has lateral pain of the right foot.  He has some swelling at times.  He went to [place] and plans to go to the [place] next week.  He is taking his allopurinol and pain medicine. ? ?I will get new serum uric acid level. ? ?He has lateral right foot pain but no swelling, no redness.  NV intact.  Limp right. ? ?Encounter Diagnosis  ?Name Primary?  ? 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 ?Get labs. ? ?Return in three weeks. ? ?Call if any problem. ? ?Precautions discussed. ? ?Electronically Signed ?Darreld Mclean, MD ?3/9/20239:40 AM ? ?

## 2021-08-19 ENCOUNTER — Emergency Department (HOSPITAL_COMMUNITY)
Admission: EM | Admit: 2021-08-19 | Discharge: 2021-08-19 | Disposition: A | Discharge status: Home / Self Care | Payer: Medicare Other | Attending: Emergency Medicine | Admitting: Emergency Medicine

## 2021-08-19 ENCOUNTER — Telehealth: Payer: Self-pay | Admitting: Orthopaedic Surgery

## 2021-08-19 ENCOUNTER — Emergency Department (HOSPITAL_COMMUNITY): Payer: Medicare Other

## 2021-08-19 ENCOUNTER — Encounter (HOSPITAL_COMMUNITY): Payer: Self-pay | Admitting: Emergency Medicine

## 2021-08-19 ENCOUNTER — Other Ambulatory Visit: Payer: Self-pay

## 2021-08-19 DIAGNOSIS — M25521 Pain in right elbow: Secondary | ICD-10-CM | POA: Diagnosis present

## 2021-08-19 DIAGNOSIS — X58XXXA Exposure to other specified factors, initial encounter: Secondary | ICD-10-CM | POA: Diagnosis not present

## 2021-08-19 MED ORDER — OXYCODONE-ACETAMINOPHEN 5-325 MG PO TABS: 1.0000 | ORAL_TABLET | Freq: Four times a day (QID) | ORAL | 0 refills | Status: DC | PRN

## 2021-08-19 MED ORDER — PREDNISONE 50 MG PO TABS
60.0000 mg | ORAL_TABLET | Freq: Once | ORAL | Status: AC
  Administered 2021-08-19: 60 mg via ORAL
  Filled 2021-08-19: qty 1

## 2021-08-19 MED ORDER — PREDNISONE 5 MG (21) PO TBPK: ORAL_TABLET | ORAL | 0 refills | Status: DC

## 2021-08-19 MED ORDER — KETOROLAC TROMETHAMINE 60 MG/2ML IM SOLN
60.0000 mg | Freq: Once | INTRAMUSCULAR | Status: AC
  Administered 2021-08-19: 60 mg via INTRAMUSCULAR
  Filled 2021-08-19: qty 2
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
## 2021-08-19 NOTE — Telephone Encounter (Signed)
Patient called request refill on his pain medicine  ? ?HYDROcodone-acetaminophen (NORCO) 7.5-325 MG tablet ? ? ?Pharmacy:  [facility] on [address]  ? ?Patient also got the prescription below filled on 08/19/21 per the med list  ? ? ?oxyCODONE-acetaminophen (PERCOCET/ROXICET) 5-325 MG tablet   --  08/19/21  --  Gilda Crease, MD   ? Take 1 tablet by mouth every 6 (six) hours as needed for severe pain.  ? ?

## 2021-08-19 NOTE — ED Triage Notes (Signed)
Pt c/o right elbow pain that started today. Pt denies any injury.  ?

## 2021-08-19 NOTE — Discharge Instructions (Addendum)
See if Dr. Hilda Lias can see you in the office before you leave for the [place] on Friday.  If you develop any redness, increased swelling, uncontrolled pain or you have a fever, come back to the ER for a recheck. ?

## 2021-08-20 MED FILL — Oxycodone w/ Acetaminophen Tab 5-325 MG: ORAL | Qty: 6 | Status: AC

## 2021-08-20 NOTE — Telephone Encounter (Signed)
Patient left a voice message - requests a call back.  ?

## 2021-08-21 ENCOUNTER — Encounter: Payer: Self-pay | Admitting: Orthopaedic Surgery

## 2021-08-21 ENCOUNTER — Other Ambulatory Visit: Payer: Self-pay

## 2021-08-21 ENCOUNTER — Ambulatory Visit: Payer: Medicare Other | Admitting: Orthopaedic Surgery

## 2021-08-21 VITALS — BP 139/84 | HR 69 | Ht 73.0 in | Wt 399.0 lb

## 2021-08-21 DIAGNOSIS — M1A0710 Idiopathic chronic gout, right ankle and foot, without tophus (tophi): Secondary | ICD-10-CM | POA: Diagnosis not present

## 2021-08-21 DIAGNOSIS — Z6841 Body Mass Index (BMI) 40.0 and over, adult: Secondary | ICD-10-CM

## 2021-08-21 LAB — URIC ACID: Uric Acid, Serum: 7 mg/dL (ref 4.0–8.0)

## 2021-08-21 NOTE — Progress Notes (Signed)
My right elbow flared up with gout. ? ?He had acute episode of gout pain in the right elbow a few days ago and was seen in the ER on 08-19-21.  He was given Percocet by the ER. ? ?He is planning to go to the [place] later today and will be back next Thursday.  He had asked for pain medicine.  I told him I could not do this as he just got pain medicine from the ER, a stronger strength, just a few days ago.  I can do a refill next week but not today.  He reluctantly understands. ? ?The right elbow is tender over the olecranon area but has no swelling or redness like he said it had a few days ago.  The ER also gave him prednisone which has helped. ? ?His uric acid level was 7.0, down from 8.2 a month ago.  That is a good sign.  He is to continue the allopurinol. ? ?Encounter Diagnoses  ?Name Primary?  ? 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 Body mass index 45.0-49.9, adult (HCC)   ? Morbid obesity (HCC)   ? ?Call if any problem. ? ?Precautions discussed. ? ?Keep regular appointment. ? ?I can call in pain medicine next Thursday. ? ?Electronically Signed ?Darreld Mclean, MD ?3/16/20239:00 AM ? ?

## 2021-08-25 ENCOUNTER — Telehealth: Payer: Self-pay

## 2021-08-25 MED ORDER — HYDROCODONE-ACETAMINOPHEN 5-325 MG PO TABS: ORAL_TABLET | ORAL | 0 refills | Status: DC

## 2021-08-25 NOTE — Telephone Encounter (Signed)
Hydrocodone- Acetaminophen 7.5/325 MG Qty 28 Tablets  ? ?PATIENT USES [facility] ON [address] ?

## 2021-09-01 ENCOUNTER — Telehealth: Payer: Self-pay | Admitting: Orthopaedic Surgery

## 2021-09-01 MED ORDER — HYDROCODONE-ACETAMINOPHEN 5-325 MG PO TABS: ORAL_TABLET | ORAL | 0 refills | Status: DC

## 2021-09-01 NOTE — Telephone Encounter (Signed)
Patient called for refill- ? ?HYDROcodone-acetaminophen (NORCO/VICODIN) 5-325 MG tablet 28 tablet  ?  ?     [facility], [address], [place] ?

## 2021-09-01 NOTE — Telephone Encounter (Signed)
Done

## 2021-09-03 ENCOUNTER — Encounter: Payer: Medicare Other | Admitting: Internal Medicine

## 2021-09-04 ENCOUNTER — Encounter: Payer: Self-pay | Admitting: Orthopaedic Surgery

## 2021-09-04 ENCOUNTER — Ambulatory Visit (INDEPENDENT_AMBULATORY_CARE_PROVIDER_SITE_OTHER): Payer: Medicare Other | Admitting: Orthopaedic Surgery

## 2021-09-04 VITALS — Ht 74.0 in | Wt 387.0 lb

## 2021-09-04 DIAGNOSIS — Z6841 Body Mass Index (BMI) 40.0 and over, adult: Secondary | ICD-10-CM

## 2021-09-04 DIAGNOSIS — M1A0710 Idiopathic chronic gout, right ankle and foot, without tophus (tophi): Secondary | ICD-10-CM | POA: Diagnosis not present

## 2021-09-04 NOTE — Progress Notes (Signed)
I am a little better. ? ?His uric acid level was 7.0, down from 8.2.  He is taking his allopurinol but still having some joint pains.  He has no new trauma. ? ?His right shoulder is slightly tender but has full ROM.  NV intact.  His feet are not red and not tender today.  Gait is normal. ? ?Encounter Diagnoses  ?Name Primary?  ? 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 Body mass index 45.0-49.9, adult (HCC)   ? Morbid obesity (HCC)   ? ?Continue the allopurinol. ? ?I will increase pain medicine to a month at a time next time he needs it. ? ?Call if any problem. ? ?Precautions discussed. ? ?Return in one month. ? ?Electronically Signed ?Darreld Mclean, MD ?3/30/20239:35 AM ? ?

## 2021-09-08 ENCOUNTER — Telehealth: Payer: Self-pay

## 2021-09-08 MED ORDER — HYDROCODONE-ACETAMINOPHEN 5-325 MG PO TABS: ORAL_TABLET | ORAL | 0 refills | Status: DC

## 2021-09-08 NOTE — Telephone Encounter (Signed)
Hydrocodone-Acetaminophen 5/325 mg  Qty 24 Tablets ? ?PATIENT USES [facility] ON [address] ? ? ?

## 2021-09-09 NOTE — Telephone Encounter (Signed)
Noted. Patient aware 

## 2021-09-09 NOTE — Telephone Encounter (Signed)
Patient called back to relay that his prescription refill of HYDROcodone-acetaminophen (NORCO/VICODIN) 5-325 MG tablet ? [facility] [employer] [facility] [address], [place]) ? - sdtates that Dr Hilda Lias, as discussed, was "to make his refill for 1 month," and said "it is for 1 week again"  ?Please advise. ?

## 2021-09-13 ENCOUNTER — Other Ambulatory Visit: Payer: Self-pay | Admitting: Internal Medicine

## 2021-09-13 DIAGNOSIS — J4541 Moderate persistent asthma with (acute) exacerbation: Secondary | ICD-10-CM

## 2021-09-16 ENCOUNTER — Telehealth: Payer: Self-pay

## 2021-09-16 MED ORDER — HYDROCODONE-ACETAMINOPHEN 7.5-325 MG PO TABS: 1.0000 | ORAL_TABLET | Freq: Four times a day (QID) | ORAL | 0 refills | Status: DC | PRN

## 2021-09-16 MED ORDER — PREDNISONE 5 MG (21) PO TBPK: ORAL_TABLET | ORAL | 0 refills | Status: DC

## 2021-09-16 NOTE — Telephone Encounter (Signed)
Patient is asking for refills on: ? ?Hydrocodone-Acetaminophen   ? ?Prednisone ? ?PATIENT USES [facility] ON [address] ?

## 2021-09-29 ENCOUNTER — Telehealth: Payer: Self-pay | Admitting: Orthopaedic Surgery

## 2021-09-29 NOTE — Telephone Encounter (Signed)
Martin Bruce with [facility] called and she is concern about the patient taking both of these medicine together, he is getting his Xanax from a different doctor.  Please call her back at [phone] before filling RX.  ? ? ?HYDROcodone-acetaminophen (NORCO) 7.5-325 MG tablet ? ? ?alprazolam Prudy Feeler) 2 MG tablet  ?getting from a different doctor  ? ? ? ?

## 2021-10-02 ENCOUNTER — Ambulatory Visit: Payer: Medicare Other | Admitting: Orthopaedic Surgery

## 2021-10-02 ENCOUNTER — Encounter: Payer: Self-pay | Admitting: Orthopaedic Surgery

## 2021-10-02 ENCOUNTER — Telehealth: Payer: Self-pay

## 2021-10-02 DIAGNOSIS — M1A0710 Idiopathic chronic gout, right ankle and foot, without tophus (tophi): Secondary | ICD-10-CM

## 2021-10-02 DIAGNOSIS — M542 Cervicalgia: Secondary | ICD-10-CM

## 2021-10-02 DIAGNOSIS — Z6841 Body Mass Index (BMI) 40.0 and over, adult: Secondary | ICD-10-CM

## 2021-10-02 MED ORDER — HYDROCODONE-ACETAMINOPHEN 7.5-325 MG PO TABS: 1.0000 | ORAL_TABLET | Freq: Four times a day (QID) | ORAL | 0 refills | Status: DC | PRN

## 2021-10-02 NOTE — Telephone Encounter (Signed)
Hydrcodone-Acetaminophen 7.5/325 MG Qty 100 Tablets ? ?PATIENT USES [facility] ? ?Patient states [facility] is not receiving any new patients for narcotics ?

## 2021-10-02 NOTE — Progress Notes (Signed)
My neck is hurting more ? ?He has had more neck pain over the last week.  He has no weakness.  He has no trauma or paresthesias.  He is taking his medicine. ? ?His gout is under control. ? ?Neck has full ROM and NV intact. Grips normal. ? ?Encounter Diagnoses  ?Name Primary?  ? 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 Neck pain   ? Body mass index 45.0-49.9, adult (HCC)   ? Morbid obesity (HCC)   ? ?I will refill pain medicine. ? ?I have reviewed the [place] Controlled Substance Reporting System web site prior to prescribing narcotic medicine for this patient. ? ?Return in six weeks. ? ?Call if any problem. ? ?Precautions discussed. ? ?Electronically Signed ?Darreld Mclean, MD ?4/27/202310:10 AM ? ?

## 2021-10-02 NOTE — Addendum Note (Signed)
Addended by: Willette Pa on: 10/02/2021 10:56 AM ? ? Modules accepted: Orders ? ?

## 2021-10-02 NOTE — Telephone Encounter (Signed)
Hydrcodone-Acetaminophen 7.5/325 MG Qty 100 Tablets ?  ?PATIENT USES [facility] ?  ?Patient states [facility] is not receiving any new patients for narco ?

## 2021-10-14 ENCOUNTER — Telehealth: Payer: Self-pay | Admitting: Orthopaedic Surgery

## 2021-10-14 NOTE — Telephone Encounter (Signed)
Done

## 2021-10-14 NOTE — Telephone Encounter (Signed)
Patient called to requests refill for the following medication, states for his gout: ?predniSONE (STERAPRED UNI-PAK 21 TAB) 5 MG (21) TBPK tablet 21 tablet  ?     [facility], [place] ?

## 2021-10-14 NOTE — Telephone Encounter (Signed)
done

## 2021-10-15 MED ORDER — PREDNISONE 5 MG (21) PO TBPK: ORAL_TABLET | ORAL | 0 refills | Status: DC

## 2021-10-23 ENCOUNTER — Emergency Department
Admission: EM | Admit: 2021-10-23 | Discharge: 2021-10-23 | Disposition: A | Discharge status: Home / Self Care | Payer: Medicare Other | Attending: Emergency Medicine | Admitting: Emergency Medicine

## 2021-10-23 ENCOUNTER — Emergency Department: Payer: Medicare Other

## 2021-10-23 ENCOUNTER — Encounter: Payer: Self-pay | Admitting: Emergency Medicine

## 2021-10-23 ENCOUNTER — Other Ambulatory Visit: Payer: Self-pay

## 2021-10-23 DIAGNOSIS — N5082 Scrotal pain: Secondary | ICD-10-CM | POA: Insufficient documentation

## 2021-10-23 DIAGNOSIS — Y92410 Unspecified street and highway as the place of occurrence of the external cause: Secondary | ICD-10-CM | POA: Insufficient documentation

## 2021-10-23 DIAGNOSIS — R109 Unspecified abdominal pain: Secondary | ICD-10-CM | POA: Diagnosis not present

## 2021-10-23 DIAGNOSIS — M542 Cervicalgia: Secondary | ICD-10-CM | POA: Insufficient documentation

## 2021-10-23 DIAGNOSIS — R519 Headache, unspecified: Secondary | ICD-10-CM | POA: Insufficient documentation

## 2021-10-23 LAB — CBC WITH DIFFERENTIAL/PLATELET
Abs Immature Granulocytes: 0.03 10*3/uL (ref 0.00–0.07)
Basophils Absolute: 0 10*3/uL (ref 0.0–0.1)
Basophils Relative: 1 %
Eosinophils Absolute: 0.2 10*3/uL (ref 0.0–0.5)
Eosinophils Relative: 3 %
HCT: 48.1 % (ref 39.0–52.0)
Hemoglobin: 16 g/dL (ref 13.0–17.0)
Immature Granulocytes: 0 %
Lymphocytes Relative: 33 %
Lymphs Abs: 2.6 10*3/uL (ref 0.7–4.0)
MCH: 29.6 pg (ref 26.0–34.0)
MCHC: 33.3 g/dL (ref 30.0–36.0)
MCV: 89.1 fL (ref 80.0–100.0)
Monocytes Absolute: 0.7 10*3/uL (ref 0.1–1.0)
Monocytes Relative: 9 %
Neutro Abs: 4.2 10*3/uL (ref 1.7–7.7)
Neutrophils Relative %: 54 %
Platelets: 314 10*3/uL (ref 150–400)
RBC: 5.4 MIL/uL (ref 4.22–5.81)
RDW: 12.2 % (ref 11.5–15.5)
WBC: 7.8 10*3/uL (ref 4.0–10.5)
nRBC: 0 % (ref 0.0–0.2)

## 2021-10-23 LAB — COMPREHENSIVE METABOLIC PANEL
ALT: 31 U/L (ref 0–44)
AST: 24 U/L (ref 15–41)
Albumin: 3.8 g/dL (ref 3.5–5.0)
Alkaline Phosphatase: 85 U/L (ref 38–126)
Anion gap: 10 (ref 5–15)
BUN: 17 mg/dL (ref 6–20)
CO2: 28 mmol/L (ref 22–32)
Calcium: 9 mg/dL (ref 8.9–10.3)
Chloride: 102 mmol/L (ref 98–111)
Creatinine, Ser: 1.08 mg/dL (ref 0.61–1.24)
GFR, Estimated: >60 mL/min (ref >60)
Glucose, Bld: 90 mg/dL (ref 70–99)
Potassium: 4.1 mmol/L (ref 3.5–5.1)
Sodium: 140 mmol/L (ref 135–145)
Total Bilirubin: 0.7 mg/dL (ref 0.3–1.2)
Total Protein: 7.4 g/dL (ref 6.5–8.1)

## 2021-10-23 MED ORDER — HYDROCODONE-ACETAMINOPHEN 5-325 MG PO TABS: 1.0000 | ORAL_TABLET | Freq: Four times a day (QID) | ORAL | 0 refills | Status: DC | PRN

## 2021-10-23 MED ORDER — ONDANSETRON 4 MG PO TBDP: 4.0000 mg | ORAL_TABLET | Freq: Three times a day (TID) | ORAL | 0 refills | Status: DC | PRN

## 2021-10-23 MED ORDER — IOHEXOL 350 MG/ML SOLN
100.0000 mL | Freq: Once | INTRAVENOUS | Status: AC | PRN
  Administered 2021-10-23: 100 mL via INTRAVENOUS

## 2021-10-23 MED ORDER — ONDANSETRON 4 MG PO TBDP: 4.0000 mg | ORAL_TABLET | Freq: Three times a day (TID) | ORAL | 0 refills | Status: AC | PRN

## 2021-10-23 MED ORDER — HYDROCODONE-ACETAMINOPHEN 5-325 MG PO TABS: 1.0000 | ORAL_TABLET | Freq: Four times a day (QID) | ORAL | 0 refills | Status: AC | PRN

## 2021-10-23 MED ORDER — ONDANSETRON HCL 4 MG/2ML IJ SOLN
4.0000 mg | Freq: Once | INTRAMUSCULAR | Status: AC
  Administered 2021-10-23: 4 mg via INTRAVENOUS
  Filled 2021-10-23: qty 2

## 2021-10-23 MED ORDER — ONDANSETRON 4 MG PO TBDP
4.0000 mg | ORAL_TABLET | Freq: Once | ORAL | Status: AC
  Administered 2021-10-23: 4 mg via ORAL
  Filled 2021-10-23: qty 1

## 2021-10-23 MED ORDER — HYDROCODONE-ACETAMINOPHEN 5-325 MG PO TABS
1.0000 | ORAL_TABLET | Freq: Once | ORAL | Status: AC
  Administered 2021-10-23: 1 via ORAL
  Filled 2021-10-23: qty 1

## 2021-10-23 MED ORDER — MORPHINE SULFATE (PF) 4 MG/ML IV SOLN
4.0000 mg | Freq: Once | INTRAVENOUS | Status: AC
  Administered 2021-10-23: 4 mg via INTRAVENOUS
  Filled 2021-10-23: qty 1
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
## 2021-10-23 NOTE — ED Notes (Signed)
Pt A&O, IV removed, pt given discharge instructions, pt left with friend, pt ambulating with steady gait.
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
## 2021-10-28 ENCOUNTER — Ambulatory Visit (INDEPENDENT_AMBULATORY_CARE_PROVIDER_SITE_OTHER): Payer: Medicare Other | Admitting: Family Medicine

## 2021-10-28 ENCOUNTER — Encounter: Payer: Self-pay | Admitting: Family Medicine

## 2021-10-28 ENCOUNTER — Other Ambulatory Visit: Payer: Self-pay | Admitting: Internal Medicine

## 2021-10-28 DIAGNOSIS — J4541 Moderate persistent asthma with (acute) exacerbation: Secondary | ICD-10-CM

## 2021-10-28 DIAGNOSIS — M25511 Pain in right shoulder: Secondary | ICD-10-CM

## 2021-10-28 MED ORDER — ALBUTEROL SULFATE HFA 108 (90 BASE) MCG/ACT IN AERS: 2.0000 | INHALATION_SPRAY | Freq: Four times a day (QID) | RESPIRATORY_TRACT | 5 refills | Status: DC | PRN

## 2021-10-28 MED ORDER — GABAPENTIN 300 MG PO CAPS: 300.0000 mg | ORAL_CAPSULE | Freq: Every day | ORAL | 0 refills | Status: AC
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
## 2021-10-29 ENCOUNTER — Encounter: Payer: Self-pay | Admitting: Family Medicine
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
## 2021-11-11 ENCOUNTER — Telehealth: Payer: Self-pay | Admitting: Orthopaedic Surgery

## 2021-11-11 MED ORDER — HYDROCODONE-ACETAMINOPHEN 7.5-325 MG PO TABS: 1.0000 | ORAL_TABLET | Freq: Four times a day (QID) | ORAL | 0 refills | Status: DC | PRN

## 2021-11-11 NOTE — Telephone Encounter (Signed)
Patient called for refill of pain medication - states Dr Hilda Lias last ordered 7.5/325 of HYDROcodone-acetaminophen [facility]) [facility] in [place]

## 2021-11-20 ENCOUNTER — Ambulatory Visit: Payer: Medicare Other | Admitting: Orthopaedic Surgery

## 2021-11-25 ENCOUNTER — Encounter: Payer: Self-pay | Admitting: Orthopaedic Surgery

## 2021-11-25 ENCOUNTER — Ambulatory Visit: Payer: Medicare Other | Admitting: Orthopaedic Surgery

## 2021-11-25 DIAGNOSIS — M545 Low back pain, unspecified: Secondary | ICD-10-CM | POA: Diagnosis not present

## 2021-11-25 DIAGNOSIS — M1A0710 Idiopathic chronic gout, right ankle and foot, without tophus (tophi): Secondary | ICD-10-CM

## 2021-11-25 DIAGNOSIS — Z6841 Body Mass Index (BMI) 40.0 and over, adult: Secondary | ICD-10-CM | POA: Diagnosis not present
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
## 2021-12-05 ENCOUNTER — Ambulatory Visit: Payer: Medicare Other

## 2021-12-08 ENCOUNTER — Ambulatory Visit (INDEPENDENT_AMBULATORY_CARE_PROVIDER_SITE_OTHER): Payer: Medicare Other

## 2021-12-08 DIAGNOSIS — Z0000 Encounter for general adult medical examination without abnormal findings: Secondary | ICD-10-CM | POA: Diagnosis not present

## 2021-12-08 DIAGNOSIS — Z1211 Encounter for screening for malignant neoplasm of colon: Secondary | ICD-10-CM | POA: Diagnosis not present

## 2021-12-08 NOTE — Progress Notes (Signed)
I connected with  [name] on 12/08/21 by a audio enabled telemedicine application and verified that I am speaking with the correct person using two identifiers.  Patient Location: Home  Provider Location: Office/Clinic  I discussed the limitations of evaluation and management by telemedicine. The patient expressed understanding and agreed to proceed.  Subjective:   Martin Bruce is a 46 y.o. male who presents for Medicare Annual/Subsequent preventive examination.  Review of Systems     Cardiac Risk Factors include: obesity (BMI >30kg/m2);sedentary lifestyle;male gender     Objective:    There were no vitals filed for this visit. There is no height or weight on file to calculate BMI.     12/08/2021   10:09 AM 10/23/2021    5:48 PM 08/19/2021    1:00 AM 12/04/2020    2:24 PM 10/19/2020    7:23 AM 08/09/2020    4:38 PM 01/15/2020    8:17 AM  Advanced Directives  Does Patient Have a Medical Advance Directive? No No No No No No No  Would patient like information on creating a medical advance directive? Yes (ED - Information included in AVS) No - Patient declined No - Patient declined No - Patient declined  Yes (MAU/Ambulatory/Procedural Areas - Information given)     Current Medications (verified) Outpatient Encounter Medications as of 12/08/2021  Medication Sig   albuterol (PROVENTIL) (2.5 MG/3ML) 0.083% nebulizer solution Take 2.5 mg by nebulization every 6 (six) hours as needed for wheezing or shortness of breath.   albuterol (VENTOLIN HFA) 108 (90 Base) MCG/ACT inhaler Inhale 2 puffs into the lungs every 6 (six) hours as needed for wheezing or shortness of breath.   allopurinol (ZYLOPRIM) 300 MG tablet Take 1 tablet (300 mg total) by mouth daily.   alprazolam (XANAX) 2 MG tablet Take 2 mg by mouth 3 (three) times daily.   cyclobenzaprine (FLEXERIL) 10 MG tablet Take by mouth.   FLUoxetine (PROZAC) 20 MG capsule Take 20 mg by mouth daily.   fluticasone (FLOVENT HFA) 110  MCG/ACT inhaler Inhale 2 puffs into the lungs 2 (two) times daily.   gabapentin (NEURONTIN) 300 MG capsule Take 1 capsule (300 mg total) by mouth at bedtime.   HYDROcodone-acetaminophen (NORCO) 7.5-325 MG tablet Take 1 tablet by mouth every 6 (six) hours as needed for moderate pain (Must last 30 days.).   hydrOXYzine (VISTARIL) 50 MG capsule Take 100 mg by mouth every 8 (eight) hours as needed.   naproxen (NAPROSYN) 500 MG tablet Take 1 tablet (500 mg total) by mouth 2 (two) times daily with a meal.   ofloxacin (OCUFLOX) 0.3 % ophthalmic solution Place 1 drop into the left eye 4 (four) times daily.   pantoprazole (PROTONIX) 20 MG tablet Take 1 tablet (20 mg total) by mouth daily.   predniSONE (STERAPRED UNI-PAK 21 TAB) 5 MG (21) TBPK tablet Take 6 pills first day; 5 pills second day; 4 pills third day; 3 pills fourth day; 2 pills next day and 1 pill last day.   triamcinolone (KENALOG) 0.1 % Apply topically.   No facility-administered encounter medications on file as of 12/08/2021.    Allergies (verified) Penicillins   History: Past Medical History:  Diagnosis Date   Anxiety    Asthma    Gout    Past Surgical History:  Procedure Laterality Date   CARPAL TUNNEL RELEASE     Family History  Problem Relation Age of Onset   Hypertension Mother    Cancer Father  Social History   Socioeconomic History   Marital status: Single    Spouse name: Not on file   Number of children: Not on file   Years of education: Not on file   Highest education level: Not on file  Occupational History   Not on file  Tobacco Use   Smoking status: Never    Passive exposure: Yes   Smokeless tobacco: Never  Vaping Use   Vaping Use: Never used  Substance and Sexual Activity   Alcohol use: Yes    Comment: occ   Drug use: Yes    Types: Marijuana    Comment: medical marijuana   Sexual activity: Not Currently  Other Topics Concern   Not on file  Social History Narrative   Not on file   Social  Determinants of Health   Financial Resource Strain: Medium Risk (12/04/2020)   Overall Financial Resource Strain (CARDIA)    Difficulty of Paying Living Expenses: Somewhat hard  Food Insecurity: Food Insecurity Present (12/08/2021)   Hunger Vital Sign    Worried About Running Out of Food in the Last Year: Sometimes true    Ran Out of Food in the Last Year: Sometimes true  Transportation Needs: No Transportation Needs (12/08/2021)   PRAPARE - Hydrologist (Medical): No    Lack of Transportation (Non-Medical): No  Physical Activity: Inactive (12/08/2021)   Exercise Vital Sign    Days of Exercise per Week: 0 days    Minutes of Exercise per Session: 0 min  Stress: Stress Concern Present (12/04/2020)   [place]    Feeling of Stress : To some extent  Social Connections: Socially Isolated (12/08/2021)   Social Connection and Isolation Panel [NHANES]    Frequency of Communication with Friends and Family: Three times a week    Frequency of Social Gatherings with Friends and Family: Three times a week    Attends Religious Services: Never    Active Member of Clubs or Organizations: No    Attends Archivist Meetings: Never    Marital Status: Never married    Tobacco Counseling Counseling given: Not Answered   Clinical Intake:  Pre-visit preparation completed: No        Nutritional Status: BMI > 30  Obese Diabetes: No     Diabetic?na         Activities of Daily Living    12/08/2021   10:23 AM  In your present state of health, do you have any difficulty performing the following activities:  Hearing? 0  Vision? 0  Difficulty concentrating or making decisions? 0  Walking or climbing stairs? 0  Dressing or bathing? 0  Doing errands, shopping? 0  Preparing Food and eating ? N  Using the Toilet? N  In the past six months, have you accidently leaked urine? N  Do you have  problems with loss of bowel control? N  Managing your Medications? N  Managing your Finances? N  Housekeeping or managing your Housekeeping? N    Patient Care Team: Lindell Spar, MD as PCP - General (Internal Medicine)  Indicate any recent Medical Services you may have received from other than [facility] providers in the past year (date may be approximate).     Assessment:   This is a routine wellness examination for [place].  Hearing/Vision screen No results found.  Dietary issues and exercise activities discussed: Current Exercise Habits: The patient does not participate in  regular exercise at present   Goals Addressed             This Visit's Progress    Patient Stated       Get colonoscopy        Depression Screen    06/17/2021   10:35 AM 05/06/2021   10:01 AM 12/04/2020    2:24 PM 12/04/2020    2:22 PM 06/19/2020    3:46 PM  PHQ 2/9 Scores  PHQ - 2 Score 0 0 0 0 0  PHQ- 9 Score 0        Fall Risk    12/08/2021   10:23 AM 06/17/2021   10:35 AM 05/06/2021   10:01 AM 12/04/2020    2:24 PM 06/19/2020    3:46 PM  Fall Risk   Falls in the past year? 0 0 0 0 0  Number falls in past yr: 0 0 0 0 0  Injury with Fall? 0 0 0 0 0  Risk for fall due to :  No Fall Risks No Fall Risks No Fall Risks No Fall Risks  Follow up  Falls evaluation completed Falls evaluation completed Falls evaluation completed Falls evaluation completed    [place]:  Any stairs in or around the home? No  If so, are there any without handrails? No  Home free of loose throw rugs in walkways, pet beds, electrical cords, etc? Yes  Adequate lighting in your home to reduce risk of falls? Yes   ASSISTIVE DEVICES UTILIZED TO PREVENT FALLS:  Life alert? No  Use of a cane, walker or w/c? No  Grab bars in the bathroom? No  Shower chair or bench in shower? No  Elevated toilet seat or a handicapped toilet? No   Cognitive Function:        12/08/2021   10:30 AM  12/04/2020    2:25 PM  6CIT Screen  What Year? 0 points 0 points  What month? 0 points 0 points  What time? 0 points 0 points  Count back from 20 0 points 0 points  Months in reverse 0 points 2 points  Repeat phrase 0 points 2 points  Total Score 0 points 4 points    Immunizations Immunization History  Administered Date(s) Administered   Tdap 06/08/2008, 06/15/2018    TDAP status: Up to date  Flu Vaccine status: Up to date  Pneumococcal vaccine status: Up to date  Covid-19 vaccine status: Declined, Education has been provided regarding the importance of this vaccine but patient still declined. Advised may receive this vaccine at local pharmacy or Health Dept.or vaccine clinic. Aware to provide a copy of the vaccination record if obtained from local pharmacy or Health Dept. Verbalized acceptance and understanding.  Qualifies for Shingles Vaccine? No   Zostavax completed No   Shingrix Completed?: No.    Education has been provided regarding the importance of this vaccine. Patient has been advised to call insurance company to determine out of pocket expense if they have not yet received this vaccine. Advised may also receive vaccine at local pharmacy or Health Dept. Verbalized acceptance and understanding.  Screening Tests Health Maintenance  Topic Date Due   COVID-19 Vaccine (1) Never done   HIV Screening  Never done   Hepatitis C Screening  Never done   COLONOSCOPY (Pts 45-73yrs Insurance coverage will need to be confirmed)  Never done   INFLUENZA VACCINE  01/06/2022   TETANUS/TDAP  06/15/2028   HPV VACCINES  Aged Out    Health Maintenance  Health Maintenance Due  Topic Date Due   COVID-19 Vaccine (1) Never done   HIV Screening  Never done   Hepatitis C Screening  Never done   COLONOSCOPY (Pts 45-36yrs Insurance coverage will need to be confirmed)  Never done    Colorectal cancer screening: Referral to GI placed  . Pt aware the office will call re: appt.  Lung  Cancer Screening: (Low Dose CT Chest recommended if Age 64-80 years, 30 pack-year currently smoking OR have quit w/in 15years.) does not qualify.   Lung Cancer Screening Referral: na  Additional Screening:  Hepatitis C Screening: does not qualify;  Vision Screening: Recommended annual ophthalmology exams for early detection of glaucoma and other disorders of the eye. Is the patient up to date with their annual eye exam?  No  Who is the provider or what is the name of the office in which the patient attends annual eye exams?  If pt is not established with a provider, would they like to be referred to a provider to establish care? Yes .   Dental Screening: Recommended annual dental exams for proper oral hygiene  Community Resource Referral / Chronic Care Management: CRR required this visit?  No   CCM required this visit?  No      Plan:     I have personally reviewed and noted the following in the patient's chart:   Medical and social history Use of alcohol, tobacco or illicit drugs  Current medications and supplements including opioid prescriptions. Patient is not currently taking opioid prescriptions. Functional ability and status Nutritional status Physical activity Advanced directives List of other physicians Hospitalizations, surgeries, and ER visits in previous 12 months Vitals Screenings to include cognitive, depression, and falls Referrals and appointments  In addition, I have reviewed and discussed with patient certain preventive protocols, quality metrics, and best practice recommendations. A written personalized care plan for preventive services as well as general preventive health recommendations were provided to patient.     Abner Greenspan, LPN   7/0/3500   Nurse Notes:  Mr. Castilleja , Thank you for taking time to come for your Medicare Wellness Visit. I appreciate your ongoing commitment to your health goals. Please review the following plan we discussed and  let me know if I can assist you in the future.   These are the goals we discussed:  Goals      Patient Stated     Get colonoscopy         This is a list of the screening recommended for you and due dates:  Health Maintenance  Topic Date Due   COVID-19 Vaccine (1) Never done   HIV Screening  Never done   Hepatitis C Screening: USPSTF Recommendation to screen - Ages 64-79 yo.  Never done   Colon Cancer Screening  Never done   Flu Shot  01/06/2022   Tetanus Vaccine  06/15/2028   HPV Vaccine  Aged Out

## 2021-12-08 NOTE — Patient Instructions (Signed)
  Mr. Alvira , Thank you for taking time to come for your Medicare Wellness Visit. I appreciate your ongoing commitment to your health goals. Please review the following plan we discussed and let me know if I can assist you in the future.   You are referred for an eye exam and for a colonoscopy.     These are the goals we discussed:  Goals      Patient Stated     Get colonoscopy         This is a list of the screening recommended for you and due dates:  Health Maintenance  Topic Date Due   COVID-19 Vaccine (1) Never done   HIV Screening  Never done   Hepatitis C Screening: USPSTF Recommendation to screen - Ages 22-79 yo.  Never done   Colon Cancer Screening  Never done   Flu Shot  01/06/2022   Tetanus Vaccine  06/15/2028   HPV Vaccine  Aged Out

## 2021-12-11 ENCOUNTER — Other Ambulatory Visit: Payer: Self-pay | Admitting: Orthopedic Surgery

## 2021-12-11 ENCOUNTER — Telehealth: Payer: Self-pay | Admitting: Orthopaedic Surgery

## 2021-12-11 DIAGNOSIS — M1A0710 Idiopathic chronic gout, right ankle and foot, without tophus (tophi): Secondary | ICD-10-CM

## 2021-12-11 IMAGING — DX DG FOOT COMPLETE 3+V*R*
3 series · 3 of 3 positions shown · non-contrast
Comparison: None.

CLINICAL DATA: Great toe pain without trauma

EXAM:
RIGHT FOOT COMPLETE - 3+ VIEW

[foot ap]
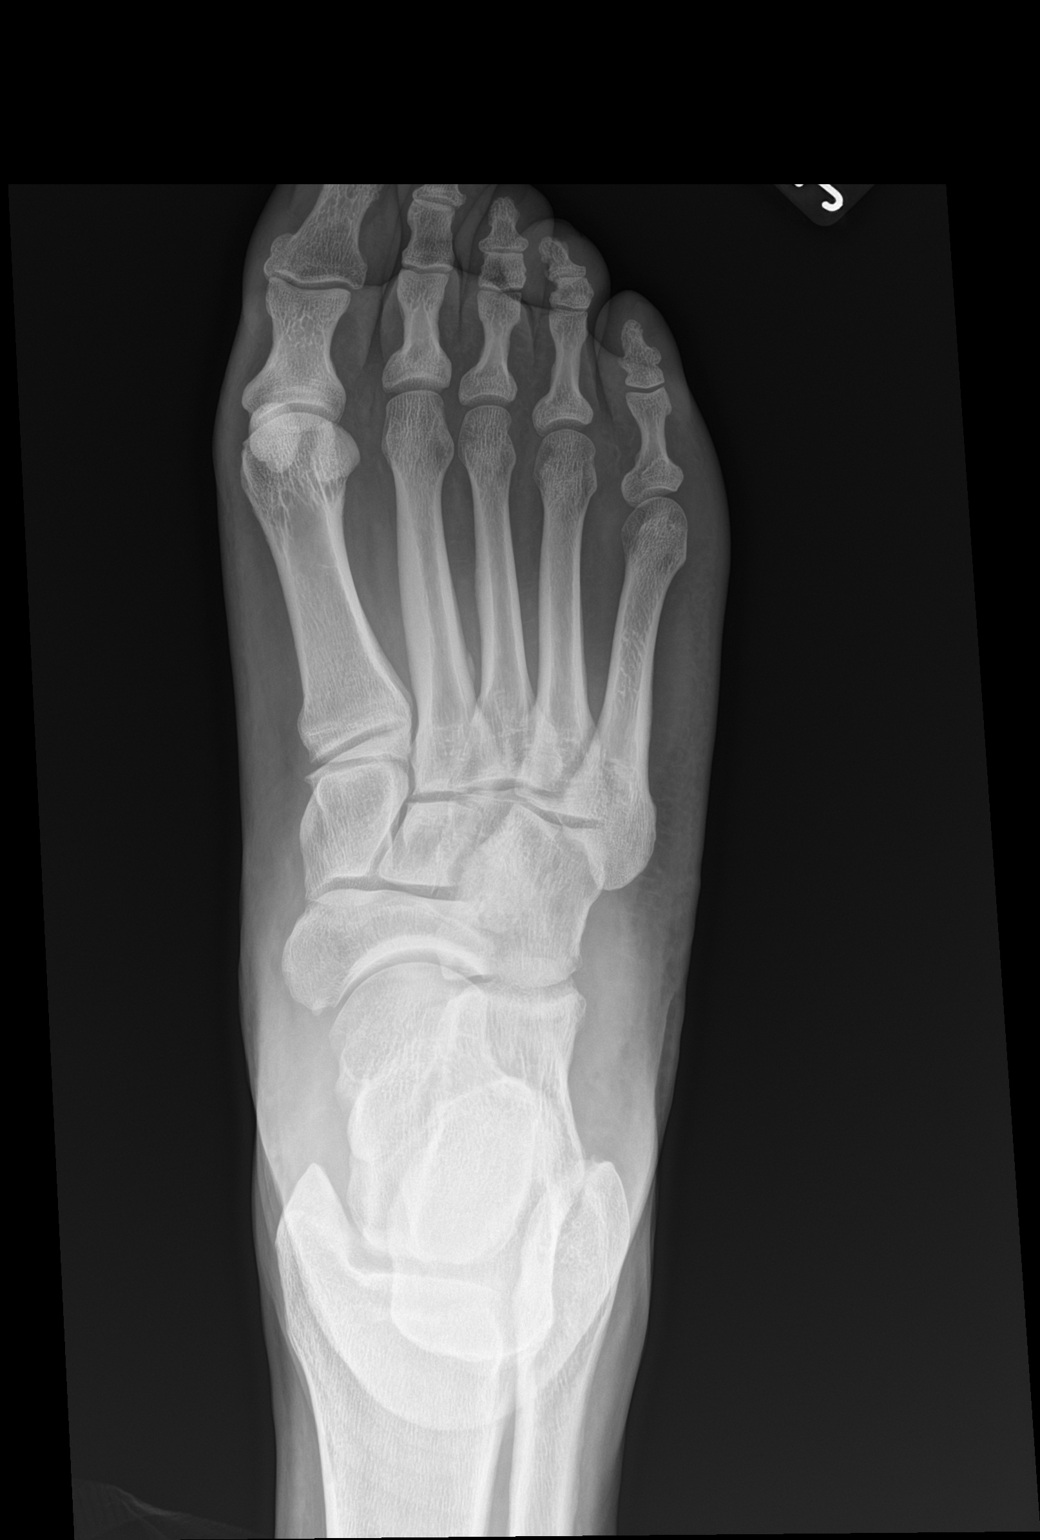

[foot obl]
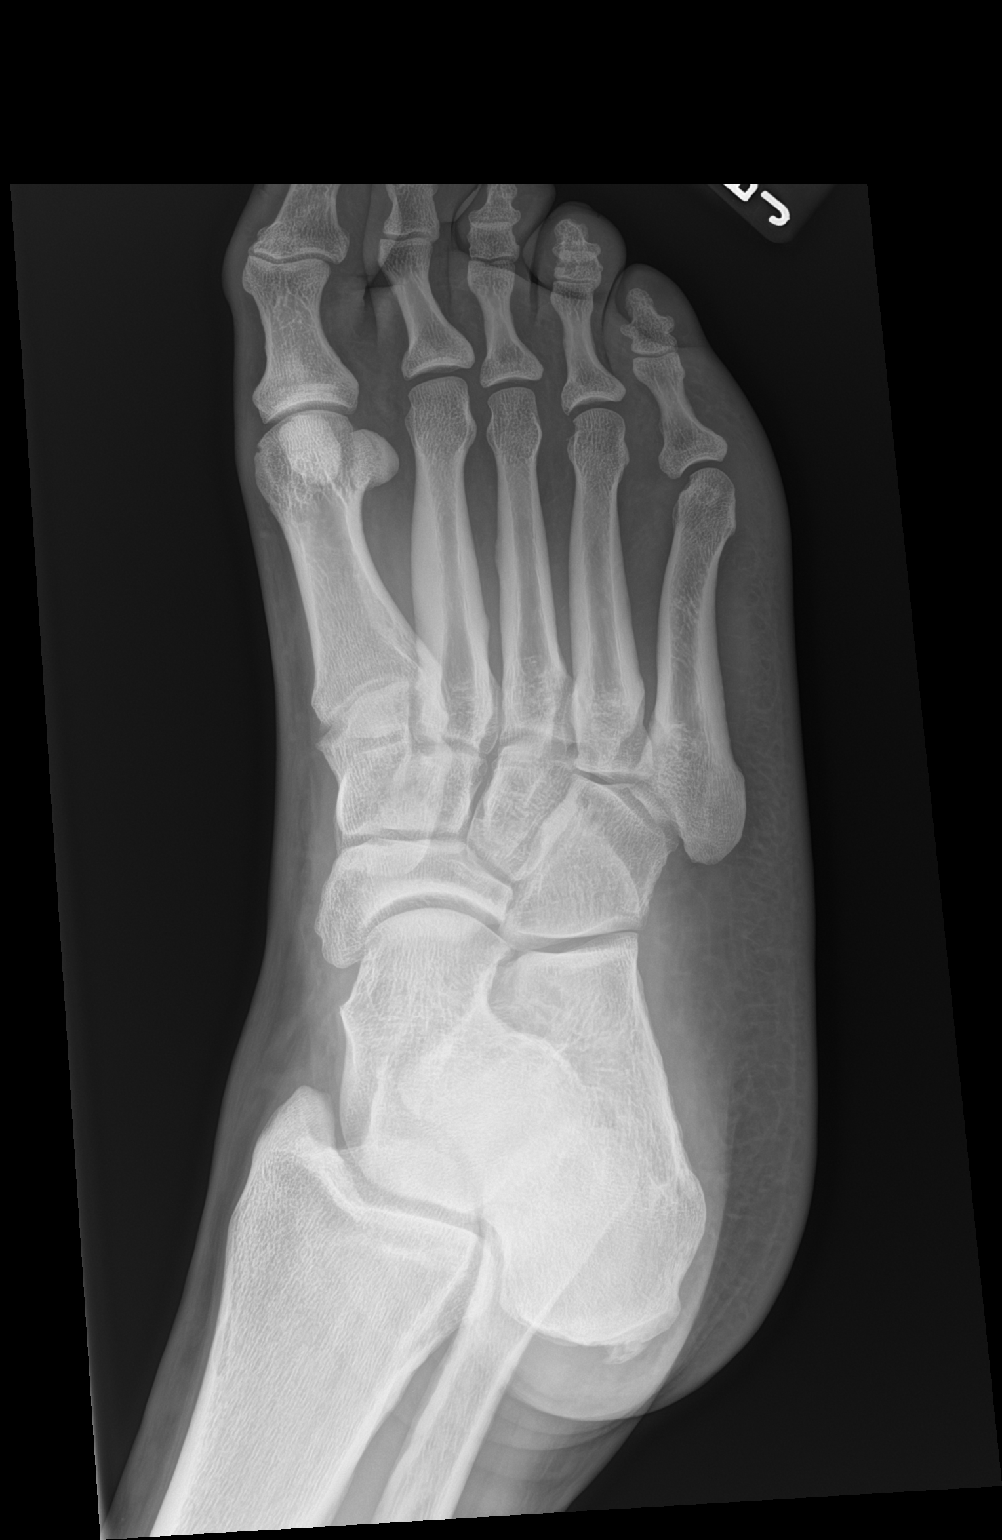

[foot lat]
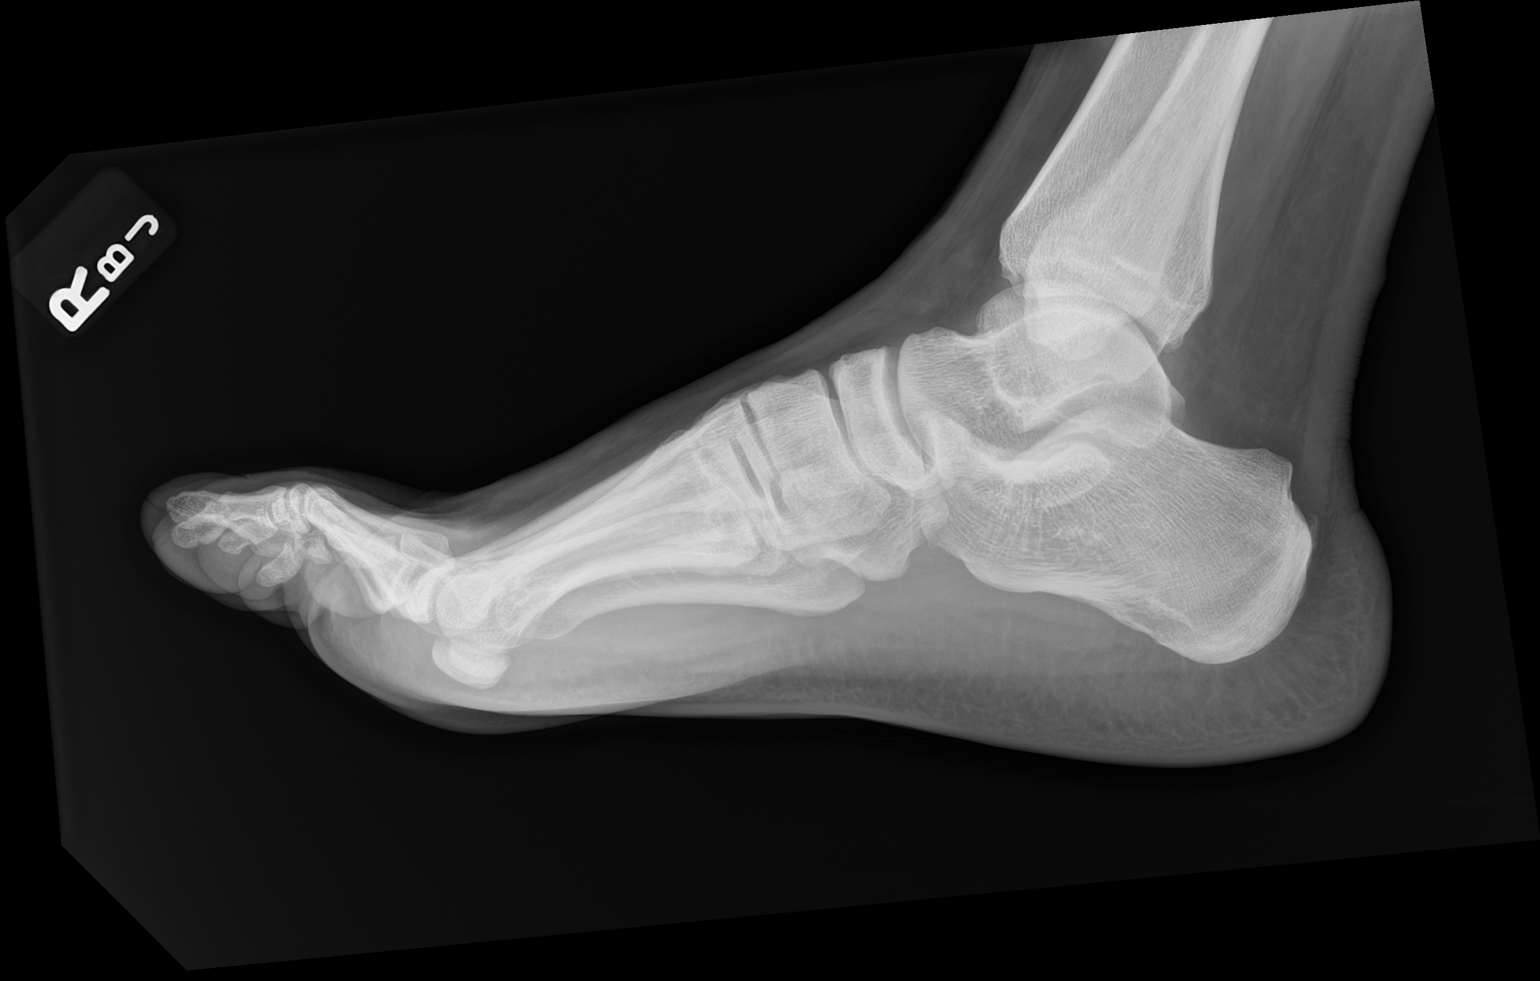

[3 of 3 positions shown; findings below may reference images not displayed]

FINDINGS: There is no evidence of fracture or dislocation. There is no
evidence of arthropathy or other focal bone abnormality. Soft
tissues are unremarkable.
IMPRESSION: Negative.

## 2021-12-11 MED ORDER — PREDNISONE 5 MG (21) PO TBPK: ORAL_TABLET | ORAL | 0 refills | Status: DC

## 2021-12-11 NOTE — Telephone Encounter (Signed)
Ok done

## 2021-12-11 NOTE — Progress Notes (Signed)
Meds ordered this encounter  Medications   predniSONE (STERAPRED UNI-PAK 21 TAB) 5 MG (21) TBPK tablet    Sig: Take 6 pills first day; 5 pills second day; 4 pills third day; 3 pills fourth day; 2 pills next day and 1 pill last day.    Dispense:  21 tablet    Refill:  0

## 2021-12-11 NOTE — Telephone Encounter (Signed)
Patient of Dr Hilda Lias - aware Dr Hilda Lias is out of clinic until 12/16/21, called to ask for a refill on: Prednisone uni-pack / [facility], [place]  - states he can feel his gout coming back; said having pain in his feet. States he has gout medication as well, but requests this medication. Aware we are sending message for review and advice.

## 2021-12-15 ENCOUNTER — Encounter: Payer: Self-pay | Admitting: *Deleted

## 2021-12-16 ENCOUNTER — Telehealth: Payer: Self-pay | Admitting: Orthopaedic Surgery

## 2021-12-16 MED ORDER — HYDROCODONE-ACETAMINOPHEN 7.5-325 MG PO TABS: 1.0000 | ORAL_TABLET | Freq: Four times a day (QID) | ORAL | 0 refills | Status: DC | PRN

## 2021-12-16 NOTE — Telephone Encounter (Signed)
Patient requests refill: HYDROcodone-acetaminophen (NORCO) 7.5-325 MG tablet 100 tablet       [employer], [place]

## 2021-12-30 ENCOUNTER — Ambulatory Visit: Payer: Medicare Other | Admitting: Family Medicine

## 2021-12-30 ENCOUNTER — Ambulatory Visit: Payer: Medicare Other | Admitting: Internal Medicine

## 2022-01-06 ENCOUNTER — Telehealth: Payer: Self-pay | Admitting: Orthopaedic Surgery

## 2022-01-06 ENCOUNTER — Ambulatory Visit (INDEPENDENT_AMBULATORY_CARE_PROVIDER_SITE_OTHER): Payer: Medicare Other | Admitting: Orthopaedic Surgery

## 2022-01-06 ENCOUNTER — Encounter: Payer: Self-pay | Admitting: Orthopaedic Surgery

## 2022-01-06 VITALS — BP 162/100 | HR 63 | Ht 74.0 in | Wt 384.0 lb

## 2022-01-06 DIAGNOSIS — M545 Low back pain, unspecified: Secondary | ICD-10-CM | POA: Diagnosis not present

## 2022-01-06 DIAGNOSIS — M1A0710 Idiopathic chronic gout, right ankle and foot, without tophus (tophi): Secondary | ICD-10-CM | POA: Diagnosis not present

## 2022-01-06 DIAGNOSIS — Z6841 Body Mass Index (BMI) 40.0 and over, adult: Secondary | ICD-10-CM

## 2022-01-06 MED ORDER — NAPROXEN 500 MG PO TABS: 500.0000 mg | ORAL_TABLET | Freq: Two times a day (BID) | ORAL | 5 refills | Status: DC

## 2022-01-06 NOTE — Progress Notes (Signed)
My back still hurts.  He has lower back pain, more on the right side today.  He has no new trauma.  He has no paresthesias.  He has no weakness.  Spine/Pelvis examination:  Inspection:  Overall, sacoiliac joint benign and hips nontender; without crepitus or defects.   Thoracic spine inspection: Alignment normal without kyphosis present   Lumbar spine inspection:  Alignment  with normal lumbar lordosis, without scoliosis apparent.   Thoracic spine palpation:  without tenderness of spinal processes   Lumbar spine palpation: without tenderness of lumbar area; without tightness of lumbar muscles    Range of Motion:   Lumbar flexion, forward flexion is normal without pain or tenderness    Lumbar extension is full without pain or tenderness   Left lateral bend is normal without pain or tenderness   Right lateral bend is normal without pain or tenderness   Straight leg raising is normal  Strength & tone: normal   Stability overall normal stability  Encounter Diagnoses  Name Primary?   Lumbar pain Yes   Idiopathic chronic gout of right foot without tophus    Body mass index 45.0-49.9, adult (HCC)    Morbid obesity (HCC)    His pain medicine is due next week.  I will call in refill for his Naprosyn today.  Return in six weeks.  Call if any problem.  Precautions discussed.  Electronically Signed Darreld Mclean, MD 8/1/20239:45 AM

## 2022-01-06 NOTE — Telephone Encounter (Signed)
Patient states forgot to ask at time of visit about handicapped placard form. Will come by with form for Dr Hilda Lias to complete and sign.

## 2022-01-06 NOTE — Telephone Encounter (Signed)
Form brought in; completed, signed, and given to patient by Dr Hilda Lias.

## 2022-01-08 ENCOUNTER — Ambulatory Visit (INDEPENDENT_AMBULATORY_CARE_PROVIDER_SITE_OTHER): Payer: Medicare Other | Admitting: Internal Medicine

## 2022-01-08 ENCOUNTER — Encounter: Payer: Self-pay | Admitting: Internal Medicine

## 2022-01-08 VITALS — BP 138/82 | HR 84 | Resp 18 | Ht 74.0 in | Wt 386.0 lb

## 2022-01-08 DIAGNOSIS — Z114 Encounter for screening for human immunodeficiency virus [HIV]: Secondary | ICD-10-CM

## 2022-01-08 DIAGNOSIS — E782 Mixed hyperlipidemia: Secondary | ICD-10-CM

## 2022-01-08 DIAGNOSIS — J4541 Moderate persistent asthma with (acute) exacerbation: Secondary | ICD-10-CM

## 2022-01-08 DIAGNOSIS — F418 Other specified anxiety disorders: Secondary | ICD-10-CM

## 2022-01-08 DIAGNOSIS — K219 Gastro-esophageal reflux disease without esophagitis: Secondary | ICD-10-CM | POA: Diagnosis not present

## 2022-01-08 DIAGNOSIS — M109 Gout, unspecified: Secondary | ICD-10-CM

## 2022-01-08 DIAGNOSIS — Z1159 Encounter for screening for other viral diseases: Secondary | ICD-10-CM

## 2022-01-08 DIAGNOSIS — F1190 Opioid use, unspecified, uncomplicated: Secondary | ICD-10-CM

## 2022-01-08 DIAGNOSIS — R7303 Prediabetes: Secondary | ICD-10-CM

## 2022-01-08 DIAGNOSIS — E559 Vitamin D deficiency, unspecified: Secondary | ICD-10-CM

## 2022-01-08 MED ORDER — BUDESONIDE-FORMOTEROL FUMARATE 160-4.5 MCG/ACT IN AERO: 2.0000 | INHALATION_SPRAY | Freq: Two times a day (BID) | RESPIRATORY_TRACT | 3 refills | Status: AC

## 2022-01-08 NOTE — Assessment & Plan Note (Signed)
On Norco chronically for rotator cuff tear according to the patient Followed by Dr. Hilda Lias

## 2022-01-08 NOTE — Assessment & Plan Note (Signed)
Needs to use maintenance inhaler Added Symbicort Albuterol as needed for dyspnea or wheezing only Advised to use it only for asthma related dyspnea and not for anxiety/panic episode, needs to follow-up with psychiatry if he has uncontrolled anxiety

## 2022-01-08 NOTE — Assessment & Plan Note (Signed)
Reports h/o gout, multiple flares On allopurinol Check uric acid level
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
## 2022-01-08 NOTE — Patient Instructions (Addendum)
Please start using Symbicort for asthma.  Please continue taking medications as prescribed.  Please continue to follow low carb diet and ambulate as tolerated.  Please get fasting blood tests done before the next visit.

## 2022-01-08 NOTE — Assessment & Plan Note (Signed)
On Pantoprazole 

## 2022-01-08 NOTE — Assessment & Plan Note (Signed)
On Prozac Xanax 2 mg TID Follows up with Psychiatrist in [place]

## 2022-01-08 NOTE — Assessment & Plan Note (Signed)
Diet modification and moderate exercise advised Referred to Bariatric surgery as requested

## 2022-01-13 ENCOUNTER — Telehealth: Payer: Self-pay | Admitting: Orthopaedic Surgery

## 2022-01-13 MED ORDER — HYDROCODONE-ACETAMINOPHEN 7.5-325 MG PO TABS: 1.0000 | ORAL_TABLET | Freq: Four times a day (QID) | ORAL | 0 refills | Status: DC | PRN

## 2022-01-13 NOTE — Telephone Encounter (Signed)
Patient requests refill:  HYDROcodone-acetaminophen (NORCO) 7.5-325 MG tablet 100 t  [facility], [place]   100 tablet

## 2022-01-15 ENCOUNTER — Telehealth: Payer: Self-pay

## 2022-01-15 NOTE — Telephone Encounter (Signed)
Patient advised with verbal understanding is 20.00 cant get any cheaper

## 2022-01-15 NOTE — Telephone Encounter (Signed)
Patient called said the budesonide-formoterol (SYMBICORT) 160-4.5 MCG/ACT inhaler  The cost is $20.00 needs a cheapier medication.  Pharmacy: [facility]  Patient request a call back.[phone]

## 2022-02-11 ENCOUNTER — Telehealth: Payer: Self-pay | Admitting: Orthopaedic Surgery

## 2022-02-11 NOTE — Telephone Encounter (Signed)
Patient requests refill:  Disp Refills Start End   HYDROcodone-acetaminophen (NORCO) 7.5-325 MG tablet 100           [employer] [facility], [place]

## 2022-02-11 NOTE — Telephone Encounter (Signed)
done

## 2022-02-12 MED ORDER — HYDROCODONE-ACETAMINOPHEN 7.5-325 MG PO TABS
1.0000 | ORAL_TABLET | Freq: Four times a day (QID) | ORAL | 0 refills | Status: DC | PRN
Start: 2022-02-12 — End: 2022-03-11

## 2022-02-17 ENCOUNTER — Ambulatory Visit: Payer: Medicare Other | Admitting: Orthopaedic Surgery

## 2022-02-19 ENCOUNTER — Ambulatory Visit: Payer: Medicare Other | Admitting: Orthopaedic Surgery

## 2022-02-24 ENCOUNTER — Ambulatory Visit: Payer: Medicare Other | Admitting: Orthopaedic Surgery

## 2022-02-24 ENCOUNTER — Encounter: Payer: Self-pay | Admitting: Orthopaedic Surgery

## 2022-02-24 VITALS — Resp 18 | Ht 74.0 in | Wt 386.0 lb

## 2022-02-24 DIAGNOSIS — M1A0710 Idiopathic chronic gout, right ankle and foot, without tophus (tophi): Secondary | ICD-10-CM

## 2022-02-24 DIAGNOSIS — M545 Low back pain, unspecified: Secondary | ICD-10-CM | POA: Diagnosis not present

## 2022-02-24 DIAGNOSIS — Z6841 Body Mass Index (BMI) 40.0 and over, adult: Secondary | ICD-10-CM | POA: Diagnosis not present

## 2022-02-24 MED ORDER — PREDNISONE 5 MG (21) PO TBPK: ORAL_TABLET | ORAL | 0 refills | Status: DC

## 2022-02-24 NOTE — Progress Notes (Signed)
My back is about the same.  He has been traveling a lot.  He went to [place] and is planning to go to [place]. He has back pain at times depending on his activity.  He has no weakness, no numbness.  His gout is under control.  Spine/Pelvis examination:  Inspection:  Overall, sacoiliac joint benign and hips nontender; without crepitus or defects.   Thoracic spine inspection: Alignment normal without kyphosis present   Lumbar spine inspection:  Alignment  with normal lumbar lordosis, without scoliosis apparent.   Thoracic spine palpation:  without tenderness of spinal processes   Lumbar spine palpation: without tenderness of lumbar area; without tightness of lumbar muscles    Range of Motion:   Lumbar flexion, forward flexion is normal without pain or tenderness    Lumbar extension is full without pain or tenderness   Left lateral bend is normal without pain or tenderness   Right lateral bend is normal without pain or tenderness   Straight leg raising is normal  Strength & tone: normal   Stability overall normal stability  Encounter Diagnoses  Name Primary?   Lumbar pain Yes   Idiopathic chronic gout of right foot without tophus    Body mass index 45.0-49.9, adult (HCC)    Morbid obesity ([place])    I will give prednisone dose pack to have in his travels.  Return in three months.  Call if any problem.  Precautions discussed.  Electronically Signed Sanjuana Kava, MD 9/19/20232:31 PM

## 2022-02-27 ENCOUNTER — Other Ambulatory Visit: Payer: Self-pay | Admitting: Internal Medicine

## 2022-02-27 DIAGNOSIS — J4541 Moderate persistent asthma with (acute) exacerbation: Secondary | ICD-10-CM

## 2022-03-03 DIAGNOSIS — G894 Chronic pain syndrome: Secondary | ICD-10-CM | POA: Insufficient documentation

## 2022-03-03 DIAGNOSIS — J45909 Unspecified asthma, uncomplicated: Secondary | ICD-10-CM | POA: Insufficient documentation

## 2022-03-03 DIAGNOSIS — K529 Noninfective gastroenteritis and colitis, unspecified: Secondary | ICD-10-CM | POA: Insufficient documentation

## 2022-03-04 ENCOUNTER — Encounter: Payer: Self-pay | Admitting: Family Medicine

## 2022-03-04 ENCOUNTER — Telehealth (INDEPENDENT_AMBULATORY_CARE_PROVIDER_SITE_OTHER): Payer: Medicare Other | Admitting: Family Medicine

## 2022-03-04 DIAGNOSIS — R600 Localized edema: Secondary | ICD-10-CM | POA: Diagnosis not present

## 2022-03-04 DIAGNOSIS — L03115 Cellulitis of right lower limb: Secondary | ICD-10-CM

## 2022-03-04 MED ORDER — DOXYCYCLINE HYCLATE 100 MG PO TABS: 100.0000 mg | ORAL_TABLET | Freq: Two times a day (BID) | ORAL | 0 refills | Status: AC

## 2022-03-04 NOTE — Progress Notes (Unsigned)
Virtual Visit via Telephone Note   This visit type was conducted due to national recommendations for restrictions regarding the COVID-19 Pandemic (e.g. social distancing) in an effort to limit this patient's exposure and mitigate transmission in our community.  Due to his co-morbid illnesses, this patient is at least at moderate risk for complications without adequate follow up.  This format is felt to be most appropriate for this patient at this time.  The patient did not have access to video technology/had technical difficulties with video requiring transitioning to audio format only (telephone).  All issues noted in this document were discussed and addressed.  No physical exam could be performed with this format.  Please refer to the patient's chart for his  consent to telehealth for [facility].   Evaluation Performed:  Follow-up visit  Date:  03/05/2022   ID:  Martin Bruce, DOB [birth year], MRN [id number]  Patient Location: Home Provider Location: Office/Clinic  Participants: Patient Location of Patient: Home Location of Provider: Telehealth Consent was obtain for visit to be over via telehealth. I verified that I am speaking with the correct person using two identifiers.  PCP:  Lindell Spar, MD   Chief Complaint:  unilateral leg swelling  History of Present Illness:    Martin Bruce is a 46 y.o. male with c/o of warmth, swelling and redness of the right lowe extremities. He reports pain with ambulation and denies any recent injury or trauma to the affected leg. He denies fever, chills, shortness of breath, chest pain, and palpitations.  The patient does not have symptoms concerning for COVID-19 infection (fever, chills, cough, or new shortness of breath).   Past Medical, Surgical, Social History, Allergies, and Medications have been Reviewed.  Past Medical History:  Diagnosis Date   Anxiety    Asthma    Gout    Past Surgical History:  Procedure Laterality Date    CARPAL TUNNEL RELEASE       Current Meds  Medication Sig   albuterol (PROVENTIL) (2.5 MG/3ML) 0.083% nebulizer solution Take 2.5 mg by nebulization every 6 (six) hours as needed for wheezing or shortness of breath.   albuterol (VENTOLIN HFA) 108 (90 Base) MCG/ACT inhaler INHALE 2 PUFFS BY MOUTH EVERY 6 HOURS AS NEEDED FOR WHEEZING FOR SHORTNESS OF BREATH   allopurinol (ZYLOPRIM) 300 MG tablet Take 1 tablet (300 mg total) by mouth daily.   alprazolam (XANAX) 2 MG tablet Take 2 mg by mouth 3 (three) times daily.   budesonide-formoterol (SYMBICORT) 160-4.5 MCG/ACT inhaler Inhale 2 puffs into the lungs 2 (two) times daily.   cyclobenzaprine (FLEXERIL) 10 MG tablet Take by mouth.   doxycycline (VIBRA-TABS) 100 MG tablet Take 1 tablet (100 mg total) by mouth 2 (two) times daily for 6 days.   FLUoxetine (PROZAC) 20 MG capsule Take 20 mg by mouth daily.   gabapentin (NEURONTIN) 300 MG capsule Take 1 capsule (300 mg total) by mouth at bedtime.   HYDROcodone-acetaminophen (NORCO) 7.5-325 MG tablet Take 1 tablet by mouth every 6 (six) hours as needed for moderate pain (Must last 30 days.).   hydrOXYzine (VISTARIL) 50 MG capsule Take 100 mg by mouth every 8 (eight) hours as needed.   naproxen (NAPROSYN) 500 MG tablet Take 1 tablet (500 mg total) by mouth 2 (two) times daily with a meal.   pantoprazole (PROTONIX) 20 MG tablet Take 1 tablet (20 mg total) by mouth daily.   predniSONE (STERAPRED UNI-PAK 21 TAB) 5 MG (21) TBPK  tablet Take 6 pills first day; 5 pills second day; 4 pills third day; 3 pills fourth day; 2 pills next day and 1 pill last day.   triamcinolone (KENALOG) 0.1 % Apply topically.   ziprasidone (GEODON) 60 MG capsule Take 60 mg by mouth 2 (two) times daily.     Allergies:   Penicillins   ROS:   Please see the history of present illness.     All other systems reviewed and are negative.   Labs/Other Tests and Data Reviewed:    Recent Labs: 10/23/2021: ALT 31; BUN 17; Creatinine,  Ser 1.08; Hemoglobin 16.0; Platelets 314; Potassium 4.1; Sodium 140   Recent Lipid Panel No results found for: "CHOL", "TRIG", "HDL", "CHOLHDL", "LDLCALC", "LDLDIRECT"  Wt Readings from Last 3 Encounters:  02/24/22 (!) 386 lb (175.1 kg)  01/08/22 (!) 386 lb (175.1 kg)  01/06/22 (!) 384 lb (174.2 kg)     Objective:    Vital Signs:  There were no vitals taken for this visit.     ASSESSMENT & PLAN:   Cellulitis Will treat with doxycycline 100mg  BID  for 6 days given patient's allergies to penicillin Will get a u/s of the affected leg to r/o DVT   Time:   Today, I have spent 8 minutes reviewing the chart, including problem list, medications, and with the patient with telehealth technology discussing the above problems.   Medication Adjustments/Labs and Tests Ordered: Current medicines are reviewed at length with the patient today.  Concerns regarding medicines are outlined above.   Tests Ordered: Orders Placed This Encounter  Procedures   US Venous Img Lower Unilateral Right    Medication Changes: Meds ordered this encounter  Medications   doxycycline (VIBRA-TABS) 100 MG tablet    Sig: Take 1 tablet (100 mg total) by mouth 2 (two) times daily for 6 days.    Dispense:  12 tablet    Refill:  0     Note: This dictation was prepared with Dragon dictation along with smaller phrase technology. Similar sounding words can be transcribed inadequately or may not be corrected upon review. Any transcriptional errors that result from this process are unintentional.      Disposition:  Follow up  Signed, Alvira Monday, FNP  03/05/2022 7:48 AM     [place]

## 2022-03-06 ENCOUNTER — Emergency Department (HOSPITAL_COMMUNITY): Payer: Medicare Other

## 2022-03-06 ENCOUNTER — Inpatient Hospital Stay (HOSPITAL_COMMUNITY): Payer: Medicare Other

## 2022-03-06 ENCOUNTER — Other Ambulatory Visit: Payer: Self-pay

## 2022-03-06 ENCOUNTER — Encounter (HOSPITAL_COMMUNITY): Payer: Self-pay | Admitting: Emergency Medicine

## 2022-03-06 ENCOUNTER — Encounter (HOSPITAL_COMMUNITY): Payer: Self-pay

## 2022-03-06 ENCOUNTER — Inpatient Hospital Stay (HOSPITAL_COMMUNITY)
Admission: EM | Admit: 2022-03-06 | Discharge: 2022-03-08 | DRG: 309 | Discharge status: Against Medical Advice | Payer: Medicare Other | Attending: Family Medicine | Admitting: Family Medicine

## 2022-03-06 DIAGNOSIS — Z7951 Long term (current) use of inhaled steroids: Secondary | ICD-10-CM | POA: Diagnosis not present

## 2022-03-06 DIAGNOSIS — E876 Hypokalemia: Secondary | ICD-10-CM | POA: Diagnosis present

## 2022-03-06 DIAGNOSIS — Z79899 Other long term (current) drug therapy: Secondary | ICD-10-CM

## 2022-03-06 DIAGNOSIS — F419 Anxiety disorder, unspecified: Secondary | ICD-10-CM | POA: Diagnosis present

## 2022-03-06 DIAGNOSIS — Z8249 Family history of ischemic heart disease and other diseases of the circulatory system: Secondary | ICD-10-CM | POA: Diagnosis not present

## 2022-03-06 DIAGNOSIS — F418 Other specified anxiety disorders: Secondary | ICD-10-CM

## 2022-03-06 DIAGNOSIS — Z6841 Body Mass Index (BMI) 40.0 and over, adult: Secondary | ICD-10-CM | POA: Diagnosis not present

## 2022-03-06 DIAGNOSIS — I4892 Unspecified atrial flutter: Secondary | ICD-10-CM | POA: Diagnosis present

## 2022-03-06 DIAGNOSIS — I4891 Unspecified atrial fibrillation: Secondary | ICD-10-CM | POA: Diagnosis present

## 2022-03-06 DIAGNOSIS — L03115 Cellulitis of right lower limb: Secondary | ICD-10-CM

## 2022-03-06 DIAGNOSIS — R7989 Other specified abnormal findings of blood chemistry: Secondary | ICD-10-CM

## 2022-03-06 DIAGNOSIS — K219 Gastro-esophageal reflux disease without esophagitis: Secondary | ICD-10-CM | POA: Diagnosis present

## 2022-03-06 DIAGNOSIS — F32A Depression, unspecified: Secondary | ICD-10-CM | POA: Diagnosis present

## 2022-03-06 DIAGNOSIS — J45909 Unspecified asthma, uncomplicated: Secondary | ICD-10-CM | POA: Diagnosis present

## 2022-03-06 DIAGNOSIS — M109 Gout, unspecified: Secondary | ICD-10-CM | POA: Diagnosis present

## 2022-03-06 HISTORY — DX: Unspecified psychosis not due to a substance or known physiological condition: F29

## 2022-03-06 HISTORY — DX: Opioid use, unspecified, uncomplicated: F11.90

## 2022-03-06 LAB — LACTIC ACID, PLASMA
Lactic Acid, Venous: 1.7 mmol/L (ref 0.5–1.9)
Lactic Acid, Venous: 2.5 mmol/L (ref 0.5–1.9)

## 2022-03-06 LAB — CBC WITH DIFFERENTIAL/PLATELET
Abs Immature Granulocytes: 0.09 10*3/uL — ABNORMAL HIGH (ref 0.00–0.07)
Basophils Absolute: 0.1 10*3/uL (ref 0.0–0.1)
Basophils Relative: 1 %
Eosinophils Absolute: 0.1 10*3/uL (ref 0.0–0.5)
Eosinophils Relative: 1 %
HCT: 46.1 % (ref 39.0–52.0)
Hemoglobin: 15.5 g/dL (ref 13.0–17.0)
Immature Granulocytes: 1 %
Lymphocytes Relative: 26 %
Lymphs Abs: 2.4 10*3/uL (ref 0.7–4.0)
MCH: 29.6 pg (ref 26.0–34.0)
MCHC: 33.6 g/dL (ref 30.0–36.0)
MCV: 88.1 fL (ref 80.0–100.0)
Monocytes Absolute: 0.9 10*3/uL (ref 0.1–1.0)
Monocytes Relative: 10 %
Neutro Abs: 5.7 10*3/uL (ref 1.7–7.7)
Neutrophils Relative %: 61 %
Platelets: 309 10*3/uL (ref 150–400)
RBC: 5.23 MIL/uL (ref 4.22–5.81)
RDW: 12.4 % (ref 11.5–15.5)
WBC: 9.3 10*3/uL (ref 4.0–10.5)
nRBC: 0 % (ref 0.0–0.2)

## 2022-03-06 LAB — COMPREHENSIVE METABOLIC PANEL
ALT: 24 U/L (ref 0–44)
AST: 25 U/L (ref 15–41)
Albumin: 3 g/dL — ABNORMAL LOW (ref 3.5–5.0)
Alkaline Phosphatase: 92 U/L (ref 38–126)
Anion gap: 12 (ref 5–15)
BUN: 12 mg/dL (ref 6–20)
CO2: 29 mmol/L (ref 22–32)
Calcium: 8.4 mg/dL — ABNORMAL LOW (ref 8.9–10.3)
Chloride: 95 mmol/L — ABNORMAL LOW (ref 98–111)
Creatinine, Ser: 1.09 mg/dL (ref 0.61–1.24)
GFR, Estimated: >60 mL/min (ref >60)
Glucose, Bld: 114 mg/dL — ABNORMAL HIGH (ref 70–99)
Potassium: 2.8 mmol/L — ABNORMAL LOW (ref 3.5–5.1)
Sodium: 136 mmol/L (ref 135–145)
Total Bilirubin: 1 mg/dL (ref 0.3–1.2)
Total Protein: 7.8 g/dL (ref 6.5–8.1)

## 2022-03-06 LAB — APTT: aPTT: 32 seconds (ref 24–36)

## 2022-03-06 LAB — MAGNESIUM: Magnesium: 2.1 mg/dL (ref 1.7–2.4)

## 2022-03-06 LAB — PROTIME-INR
INR: 1.1 (ref 0.8–1.2)
Prothrombin Time: 13.8 seconds (ref 11.4–15.2)

## 2022-03-06 LAB — BRAIN NATRIURETIC PEPTIDE: B Natriuretic Peptide: 49 pg/mL (ref 0.0–100.0)

## 2022-03-06 LAB — D-DIMER, QUANTITATIVE: D-Dimer, Quant: 1.48 ug/mL-FEU — ABNORMAL HIGH (ref 0.00–0.50)

## 2022-03-06 LAB — TSH: TSH: 1.467 u[IU]/mL (ref 0.350–4.500)

## 2022-03-06 MED ORDER — POTASSIUM CHLORIDE 10 MEQ/100ML IV SOLN
10.0000 meq | INTRAVENOUS | Status: AC
  Administered 2022-03-06 (×3): 10 meq via INTRAVENOUS
  Filled 2022-03-06 (×2): qty 100

## 2022-03-06 MED ORDER — MOMETASONE FURO-FORMOTEROL FUM 200-5 MCG/ACT IN AERO
2.0000 | INHALATION_SPRAY | Freq: Two times a day (BID) | RESPIRATORY_TRACT | Status: DC
  Administered 2022-03-06 – 2022-03-07 (×3): 2 via RESPIRATORY_TRACT
  Filled 2022-03-06: qty 8.8

## 2022-03-06 MED ORDER — GABAPENTIN 300 MG PO CAPS
300.0000 mg | ORAL_CAPSULE | Freq: Every day | ORAL | Status: DC
  Filled 2022-03-06 (×2): qty 1

## 2022-03-06 MED ORDER — ALBUTEROL SULFATE (2.5 MG/3ML) 0.083% IN NEBU: 2.5000 mg | INHALATION_SOLUTION | Freq: Four times a day (QID) | RESPIRATORY_TRACT | Status: DC

## 2022-03-06 MED ORDER — ALBUTEROL SULFATE (2.5 MG/3ML) 0.083% IN NEBU: 2.5000 mg | INHALATION_SOLUTION | Freq: Four times a day (QID) | RESPIRATORY_TRACT | Status: DC | PRN

## 2022-03-06 MED ORDER — ACETAMINOPHEN 325 MG PO TABS: 650.0000 mg | ORAL_TABLET | Freq: Four times a day (QID) | ORAL | Status: DC | PRN

## 2022-03-06 MED ORDER — ACETAMINOPHEN 650 MG RE SUPP: 650.0000 mg | Freq: Four times a day (QID) | RECTAL | Status: DC | PRN

## 2022-03-06 MED ORDER — DILTIAZEM LOAD VIA INFUSION
20.0000 mg | Freq: Once | INTRAVENOUS | Status: AC
  Administered 2022-03-06: 20 mg via INTRAVENOUS
  Filled 2022-03-06: qty 20

## 2022-03-06 MED ORDER — OXYCODONE HCL 5 MG PO TABS: 5.0000 mg | ORAL_TABLET | ORAL | Status: DC | PRN

## 2022-03-06 MED ORDER — ZIPRASIDONE HCL 20 MG PO CAPS
60.0000 mg | ORAL_CAPSULE | Freq: Two times a day (BID) | ORAL | Status: DC
  Filled 2022-03-06 (×6): qty 1

## 2022-03-06 MED ORDER — DILTIAZEM HCL 25 MG/5ML IV SOLN
10.0000 mg | Freq: Once | INTRAVENOUS | Status: AC
  Administered 2022-03-06: 10 mg via INTRAVENOUS
  Filled 2022-03-06: qty 5

## 2022-03-06 MED ORDER — VANCOMYCIN HCL 1750 MG/350ML IV SOLN
1750.0000 mg | Freq: Two times a day (BID) | INTRAVENOUS | Status: DC
  Administered 2022-03-07 (×2): 1750 mg via INTRAVENOUS
  Filled 2022-03-06 (×3): qty 350

## 2022-03-06 MED ORDER — VANCOMYCIN HCL 2000 MG/400ML IV SOLN
2000.0000 mg | Freq: Once | INTRAVENOUS | Status: AC
  Administered 2022-03-06: 2000 mg via INTRAVENOUS
  Filled 2022-03-06: qty 400

## 2022-03-06 MED ORDER — SODIUM CHLORIDE 0.9 % IV SOLN
2.0000 g | INTRAVENOUS | Status: DC
  Administered 2022-03-07: 2 g via INTRAVENOUS
  Filled 2022-03-06: qty 20

## 2022-03-06 MED ORDER — SODIUM CHLORIDE 0.9 % IV BOLUS
500.0000 mL | Freq: Once | INTRAVENOUS | Status: AC
  Administered 2022-03-06: 500 mL via INTRAVENOUS

## 2022-03-06 MED ORDER — SODIUM CHLORIDE 0.9 % IV SOLN: INTRAVENOUS | Status: DC

## 2022-03-06 MED ORDER — ASPIRIN 81 MG PO TBEC
81.0000 mg | DELAYED_RELEASE_TABLET | Freq: Every day | ORAL | Status: DC
  Administered 2022-03-06 – 2022-03-07 (×2): 81 mg via ORAL
  Filled 2022-03-06 (×2): qty 1

## 2022-03-06 MED ORDER — HYDROCODONE-ACETAMINOPHEN 7.5-325 MG PO TABS
1.0000 | ORAL_TABLET | Freq: Four times a day (QID) | ORAL | Status: DC | PRN
  Administered 2022-03-06: 1 via ORAL
  Filled 2022-03-06: qty 1

## 2022-03-06 MED ORDER — LORAZEPAM 2 MG/ML IJ SOLN
INTRAMUSCULAR | Status: AC
  Filled 2022-03-06: qty 1

## 2022-03-06 MED ORDER — SODIUM CHLORIDE 0.9 % IV SOLN
2.0000 g | Freq: Once | INTRAVENOUS | Status: AC
  Administered 2022-03-06: 2 g via INTRAVENOUS
  Filled 2022-03-06: qty 20

## 2022-03-06 MED ORDER — IOHEXOL 350 MG/ML SOLN
100.0000 mL | Freq: Once | INTRAVENOUS | Status: AC | PRN
  Administered 2022-03-07: 100 mL via INTRAVENOUS

## 2022-03-06 MED ORDER — FLUOXETINE HCL 20 MG PO CAPS
20.0000 mg | ORAL_CAPSULE | Freq: Every day | ORAL | Status: DC
  Administered 2022-03-07: 20 mg via ORAL
  Filled 2022-03-06: qty 1

## 2022-03-06 MED ORDER — ENOXAPARIN SODIUM 40 MG/0.4ML IJ SOSY
40.0000 mg | PREFILLED_SYRINGE | INTRAMUSCULAR | Status: DC
  Filled 2022-03-06: qty 0.4

## 2022-03-06 MED ORDER — DILTIAZEM HCL-DEXTROSE 125-5 MG/125ML-% IV SOLN (PREMIX)
5.0000 mg/h | INTRAVENOUS | Status: AC
  Administered 2022-03-06: 5 mg/h via INTRAVENOUS
  Filled 2022-03-06: qty 125

## 2022-03-06 MED ORDER — SODIUM CHLORIDE 0.9 % IV SOLN: INTRAVENOUS | Status: AC

## 2022-03-06 MED ORDER — ALPRAZOLAM 1 MG PO TABS
1.0000 mg | ORAL_TABLET | Freq: Two times a day (BID) | ORAL | Status: DC | PRN
  Administered 2022-03-06 – 2022-03-07 (×3): 1 mg via ORAL
  Filled 2022-03-06: qty 2
  Filled 2022-03-06: qty 1
  Filled 2022-03-06 (×2): qty 2

## 2022-03-06 MED ORDER — VANCOMYCIN HCL IN DEXTROSE 1-5 GM/200ML-% IV SOLN: 1000.0000 mg | Freq: Once | INTRAVENOUS | Status: DC

## 2022-03-06 MED ORDER — ALLOPURINOL 100 MG PO TABS
300.0000 mg | ORAL_TABLET | Freq: Every day | ORAL | Status: DC
  Administered 2022-03-07: 300 mg via ORAL
  Filled 2022-03-06: qty 1

## 2022-03-06 MED ORDER — DILTIAZEM HCL 25 MG/5ML IV SOLN
INTRAVENOUS | Status: AC
  Filled 2022-03-06: qty 5

## 2022-03-06 MED ORDER — POTASSIUM CHLORIDE CRYS ER 20 MEQ PO TBCR
30.0000 meq | EXTENDED_RELEASE_TABLET | Freq: Four times a day (QID) | ORAL | Status: AC
  Administered 2022-03-06 (×2): 30 meq via ORAL
  Filled 2022-03-06: qty 1
  Filled 2022-03-06: qty 2

## 2022-03-06 MED ORDER — HYDROXYZINE PAMOATE 50 MG PO CAPS: 100.0000 mg | ORAL_CAPSULE | Freq: Three times a day (TID) | ORAL | Status: DC | PRN

## 2022-03-06 NOTE — ED Triage Notes (Signed)
Pt c/o lower right leg pain and swelling that is warm to touch since Monday

## 2022-03-06 NOTE — ED Provider Notes (Signed)
[facility] Provider Note   CSN: [id number] Arrival date & time: 03/06/22  1606     History {Add pertinent medical, surgical, social history, OB history to HPI:1} Chief Complaint  Patient presents with   Leg Pain    Martin Bruce is a 46 y.o. male.  HPI     Home Medications Prior to Admission medications   Medication Sig Start Date End Date Taking? Authorizing Provider  albuterol (PROVENTIL) (2.5 MG/3ML) 0.083% nebulizer solution Take 2.5 mg by nebulization every 6 (six) hours as needed for wheezing or shortness of breath.    [provider]  albuterol (VENTOLIN HFA) 108 (90 Base) MCG/ACT inhaler INHALE 2 PUFFS BY MOUTH EVERY 6 HOURS AS NEEDED FOR WHEEZING FOR SHORTNESS OF BREATH 02/27/22   Lindell Spar, MD  allopurinol (ZYLOPRIM) 300 MG tablet Take 1 tablet (300 mg total) by mouth daily. 06/25/21   Sanjuana Kava, MD  alprazolam Duanne Moron) 2 MG tablet Take 2 mg by mouth 3 (three) times daily.    [provider]  budesonide-formoterol (SYMBICORT) 160-4.5 MCG/ACT inhaler Inhale 2 puffs into the lungs 2 (two) times daily. 01/08/22   Lindell Spar, MD  cyclobenzaprine (FLEXERIL) 10 MG tablet Take by mouth. 12/25/16   [provider]  doxycycline (VIBRA-TABS) 100 MG tablet Take 1 tablet (100 mg total) by mouth 2 (two) times daily for 6 days. 03/04/22 03/10/22  Alvira Monday, FNP  FLUoxetine (PROZAC) 20 MG capsule Take 20 mg by mouth daily.    [provider]  gabapentin (NEURONTIN) 300 MG capsule Take 1 capsule (300 mg total) by mouth at bedtime. 10/28/21   Fayrene Helper, MD  HYDROcodone-acetaminophen (NORCO) 7.5-325 MG tablet Take 1 tablet by mouth every 6 (six) hours as needed for moderate pain (Must last 30 days.). 02/12/22   Sanjuana Kava, MD  hydrOXYzine (VISTARIL) 50 MG capsule Take 100 mg by mouth every 8 (eight) hours as needed. 01/20/21   [provider]  naproxen (NAPROSYN) 500 MG tablet Take 1 tablet (500 mg  total) by mouth 2 (two) times daily with a meal. 01/06/22   Sanjuana Kava, MD  pantoprazole (PROTONIX) 20 MG tablet Take 1 tablet (20 mg total) by mouth daily. 06/07/19   Triplett, Tammy, PA-C  predniSONE (STERAPRED UNI-PAK 21 TAB) 5 MG (21) TBPK tablet Take 6 pills first day; 5 pills second day; 4 pills third day; 3 pills fourth day; 2 pills next day and 1 pill last day. 02/24/22   Sanjuana Kava, MD  triamcinolone (KENALOG) 0.1 % Apply topically. 08/02/19   [provider]  ziprasidone (GEODON) 60 MG capsule Take 60 mg by mouth 2 (two) times daily. 01/30/22   [provider]      Allergies    Penicillins    Review of Systems   Review of Systems  Physical Exam Updated Vital Signs BP (!) 140/108 (BP Location: Right Arm)   Pulse (!) 173   Temp (!) 97.5 F (36.4 C) (Oral)   Resp (!) 22   Ht 6\' 2"  (1.88 m)   Wt (!) 172.4 kg   SpO2 95%   BMI 48.79 kg/m  Physical Exam  ED Results / Procedures / Treatments   Labs (all labs ordered are listed, but only abnormal results are displayed) Labs Reviewed  CULTURE, BLOOD (ROUTINE X 2)  CULTURE, BLOOD (ROUTINE X 2)  COMPREHENSIVE METABOLIC PANEL  LACTIC ACID, PLASMA  LACTIC ACID, PLASMA  CBC WITH DIFFERENTIAL/PLATELET  PROTIME-INR  URINALYSIS, ROUTINE  W REFLEX MICROSCOPIC  APTT  MAGNESIUM  D-DIMER, QUANTITATIVE  BRAIN NATRIURETIC PEPTIDE  TSH    EKG None  Radiology No results found.  Procedures Procedures  {Document cardiac monitor, telemetry assessment procedure when appropriate:1}  Medications Ordered in ED Medications  LORazepam (ATIVAN) 2 MG/ML injection (has no administration in time range)  diltiazem (CARDIZEM) 1 mg/mL load via infusion 20 mg (has no administration in time range)    And  diltiazem (CARDIZEM) 125 mg in dextrose 5% 125 mL (1 mg/mL) infusion (has no administration in time range)  diltiazem (CARDIZEM) 25 MG/5ML injection (has no administration in time range)  diltiazem (CARDIZEM)  injection 10 mg (10 mg Intravenous Given 03/06/22 1639)    ED Course/ Medical Decision Making/ A&P                           Medical Decision Making Amount and/or Complexity of Data Reviewed Labs: ordered. Radiology: ordered.  Risk Prescription drug management.   ***  {Document critical care time when appropriate:1} {Document review of labs and clinical decision tools ie heart score, Chads2Vasc2 etc:1}  {Document your independent review of radiology images, and any outside records:1} {Document your discussion with family members, caretakers, and with consultants:1} {Document social determinants of health affecting pt's care:1} {Document your decision making why or why not admission, treatments were needed:1} Final Clinical Impression(s) / ED Diagnoses Final diagnoses:  None    Rx / DC Orders ED Discharge Orders          Ordered    Amb referral to [facility]        03/06/22 1648

## 2022-03-06 NOTE — H&P (Signed)
TRH H&P   Patient Demographics:    Quientin Bacallao, is a 46 y.o. male  MRN: [id number]   DOB - 05/19/1976  Admit Date - 03/06/2022  Outpatient Primary MD for the patient is Lindell Spar, MD  Referring MD/NP/PA: DR Philip Aspen  Patient coming from: home  Chief Complaint  Patient presents with   Leg Pain      HPI:    Gustave Mcfaul  is a 46 y.o. male, past medical history of asthma, depression, anxiety, chronic gout, GERD, morbid obesity. -Patient presents secondary to complaints of right lower extremity erythema, pain and swelling, patient reports symptoms developed over last 4 days, he is unaware of any bite or scratch, patient reports history of travel to [place] a month ago, but reports flight was a short distance, he denies any chest pain, shortness of breath or hemoptysis, patient reports fever and chills at home which prompted him to come to ED, he was prescribed doxycycline 2 days ago by his PCP without much help. -In ED he was noticed to be tachycardic, he was noted to be in a flutter, heart rate in 170s, for which she started on Cardizem drip, ED discussed with cardiology, no indication for anticoagulation, patient was started on IV vancomycin and [facility] hospitalist consulted to admit.    Review of systems:      A full 10 point Review of Systems was done, except as stated above, all other Review of Systems were negative.   With Past History of the following :    Past Medical History:  Diagnosis Date   Anxiety    Asthma    Gout       Past Surgical History:  Procedure Laterality Date   CARPAL TUNNEL RELEASE        Social History:     Social History   Tobacco Use   Smoking status: Never    Passive exposure: Yes   Smokeless tobacco: Never  Substance Use Topics   Alcohol use: Yes    Comment: occ       Family History :     Family History  Problem  Relation Age of Onset   Hypertension Mother    Cancer Father      Home Medications:   Prior to Admission medications   Medication Sig Start Date End Date Taking? Authorizing Provider  albuterol (VENTOLIN HFA) 108 (90 Base) MCG/ACT inhaler INHALE 2 PUFFS BY MOUTH EVERY 6 HOURS AS NEEDED FOR WHEEZING FOR SHORTNESS OF BREATH 02/27/22  Yes Lindell Spar, MD  allopurinol (ZYLOPRIM) 300 MG tablet Take 1 tablet (300 mg total) by mouth daily. 06/25/21  Yes Sanjuana Kava, MD  alprazolam Duanne Moron) 2 MG tablet Take by mouth.   Yes [provider]  budesonide-formoterol (SYMBICORT) 160-4.5 MCG/ACT inhaler Inhale 2 puffs into the lungs 2 (two) times daily. 01/08/22  Yes Lindell Spar, MD  doxycycline (VIBRA-TABS) 100 MG tablet Take 1 tablet (100 mg total) by mouth 2 (two) times daily for 6 days. 03/04/22 03/10/22 Yes Alvira Monday, FNP  FLUoxetine (PROZAC) 20 MG capsule Take 20 mg by mouth daily.   Yes [provider]  gabapentin (NEURONTIN) 300 MG capsule Take 1 capsule (300 mg total) by mouth at bedtime. 10/28/21  Yes Fayrene Helper, MD  HYDROcodone-acetaminophen (NORCO) 7.5-325 MG tablet Take 1 tablet by mouth every 6 (six) hours as needed for moderate pain (Must last 30 days.). 02/12/22  Yes Sanjuana Kava, MD  hydrOXYzine (VISTARIL) 50 MG capsule Take 100 mg by mouth every 8 (eight) hours as needed. 01/20/21  Yes [provider]  ondansetron (ZOFRAN-ODT) 4 MG disintegrating tablet Take 4 mg by mouth every 8 (eight) hours as needed. 03/03/22  Yes [provider]  ziprasidone (GEODON) 60 MG capsule Take 60 mg by mouth 2 (two) times daily. 01/30/22  Yes [provider]  albuterol (PROVENTIL) (2.5 MG/3ML) 0.083% nebulizer solution Take 2.5 mg by nebulization every 6 (six) hours as needed for wheezing or shortness of breath.    [provider]     Allergies:     Allergies  Allergen Reactions   Penicillins Rash     Physical Exam:    Vitals  Blood pressure 111/85, pulse (!) 46, temperature 98.1 F (36.7 C), temperature source Oral, resp. rate 20, height 6\' 2"  (1.88 m), weight (!) 172.4 kg, SpO2 98 %.   1. General follow-up male, with morbid obesity, laying in bed in no apparent distress  2. Normal affect and insight, Not Suicidal or Homicidal, Awake Alert, Oriented X 3.  3. No F.N deficits, ALL C.Nerves Intact, Strength 5/5 all 4 extremities, Sensation intact all 4 extremities, Plantars down going.  4. Ears and Eyes appear Normal, Conjunctivae clear, PERRLA. Moist Oral Mucosa.  5. Supple Neck, No JVD, No cervical lymphadenopathy appriciated, No Carotid Bruits.  6. Symmetrical Chest wall movement, Good air movement bilaterally, CTAB.  7.  Regular irregular, No Gallops, Rubs or Murmurs, No Parasternal Heave.  8. Positive Bowel Sounds, Abdomen Soft, No tenderness, No organomegaly appriciated,No rebound -guarding or rigidity.  9.  No Cyanosis, Normal Skin Turgor, lower extremity with significant tenderness to palpation, erythema, warm and tender to palpation  10. Good muscle tone,  joints appear normal , no effusions, Normal ROM.      Data Review:    CBC Recent Labs  Lab 03/06/22 1628  WBC 9.3  HGB 15.5  HCT 46.1  PLT 309  MCV 88.1  MCH 29.6  MCHC 33.6  RDW 12.4  LYMPHSABS 2.4  MONOABS 0.9  EOSABS 0.1  BASOSABS 0.1   ------------------------------------------------------------------------------------------------------------------  Chemistries  Recent Labs  Lab 03/06/22 1628 03/06/22 1700  NA 136  --   K 2.8*  --   CL 95*  --   CO2 29  --   GLUCOSE 114*  --   BUN 12  --   CREATININE 1.09  --   CALCIUM 8.4*  --   MG  --  2.1  AST 25  --   ALT 24  --   ALKPHOS 92  --   BILITOT 1.0  --    ------------------------------------------------------------------------------------------------------------------ estimated creatinine clearance is 140.2 mL/min (by C-G formula based on SCr of  1.09 mg/dL). ------------------------------------------------------------------------------------------------------------------ Recent Labs    03/06/22 1649  TSH 1.467    Coagulation profile Recent Labs  Lab 03/06/22 1628  INR 1.1   ------------------------------------------------------------------------------------------------------------------- Recent Labs  03/06/22 1628  DDIMER 1.48*   -------------------------------------------------------------------------------------------------------------------  Cardiac Enzymes No results for input(s): "CKMB", "TROPONINI", "MYOGLOBIN" in the last 168 hours.  Invalid input(s): "CK" ------------------------------------------------------------------------------------------------------------------    Component Value Date/Time   BNP 49.0 03/06/2022 1649     ---------------------------------------------------------------------------------------------------------------  Urinalysis    Component Value Date/Time   COLORURINE YELLOW 06/07/2019 1244   APPEARANCEUR CLEAR 06/07/2019 1244   LABSPEC >1.046 (H) 06/07/2019 1244   [place] 5.0 06/07/2019 [place] 06/07/2019 [place] NEGATIVE 06/07/2019 [place] 06/07/2019 1244   KETONESUR 5 (A) 06/07/2019 1244   PROTEINUR NEGATIVE 06/07/2019 1244   NITRITE NEGATIVE 06/07/2019 1244   LEUKOCYTESUR TRACE (A) 06/07/2019 1244    ----------------------------------------------------------------------------------------------------------------   Imaging Results:    DG Chest Port 1 View  Result Date: 03/06/2022 CLINICAL DATA:  tachycardia, leg swelling EXAM: PORTABLE CHEST 1 VIEW COMPARISON:  None Available. FINDINGS: Is Normal mediastinum and cardiac silhouette. Normal pulmonary vasculature. No evidence of effusion, infiltrate, or pneumothorax. No acute bony abnormality. IMPRESSION: No acute cardiopulmonary process. Electronically Signed   By: Suzy Bouchard M.D.   On: 03/06/2022 17:19    My personal review of EKG: Rhythm A Fib, Rate  112 /min, QTc 475 , no    Assessment & Plan:    Principal Problem:   Atrial fibrillation with RVR (HCC) Active Problems:   Gastroesophageal reflux disease   Anxiety with depression   Gout   Morbid obesity (HCC)   Positive D dimer  A-fib with RVR -Is most likely provoked by his right lower extremity cellulitis, and severe hypokalemia -Currently heart rate controlled on Cardizem. -No indication for anticoagulation given CHA2DS2-VASc score of 0, will start on baby aspirin -Check 2D echo -TSH within normal limits -Practice hypokalemia and target potassium more than 4, magnesium more than 2  Right lower extremity cellulitis -Dx significantly warm and tender, with significant erythema, will keep on IV vancomycin and cefepime specially with no much improvement on home doxycycline  Positive D-dimers -Mildly elevated at 1.5, but given his risk factors, and recent travel, will obtain venous Dopplers and CTA chest to rule out PE  Hypokalemia -Repleted, recheck in a.m.  Gout -Continue with home dose allopurinol  Morbid obesity - Body mass index is 48.79 kg/m.   Anxiety/depression/psychosis -Continue with home medications  GERD -Continue with PPI    DVT Prophylaxis   Lovenox  AM Labs Ordered, also please review Full Orders  Family Communication: Admission, patients condition and plan of care including tests being ordered have been discussed with the patient who indicate understanding and agree with the plan and Code Status.  Code Status Full  Likely DC to  home  Condition GUARDED    Consults called: none    Admission status: inpatient    Time spent in minutes : 70 minutes   Phillips Climes M.D on 03/06/2022 at 6:46 PM   [facility] - Office  [phone]

## 2022-03-06 NOTE — Progress Notes (Signed)
Pharmacy Antibiotic Note  Martin Bruce is a 46 y.o. male admitted on 03/06/2022 with cellulitis.  Pharmacy has been consulted for vancomycin dosing.  Plan: Vancomycin 1750 IV every 12 hours.  Goal trough 15-20 mcg/mL. Goal AUC 400-500 mcg*h/ml  Height: 6\' 2"  (188 cm) Weight: (!) 172.4 kg (380 lb) IBW/kg (Calculated) : 82.2  Temp (24hrs), Avg:97.5 F (36.4 C), Min:97.5 F (36.4 C), Max:97.5 F (36.4 C)  Recent Labs  Lab 03/06/22 1628  WBC 9.3  CREATININE 1.09  LATICACIDVEN 1.7    Estimated Creatinine Clearance: 140.2 mL/min (by C-G formula based on SCr of 1.09 mg/dL).    Allergies  Allergen Reactions   Penicillins Rash    Antimicrobials this admission: Vancomycin 2000mg  x1 9/29 Ceftriaxone 2g x1 9/29  Dose adjustments this admission: N/a  Microbiology results: 9/29 BCx: pending  Thank you for allowing pharmacy to be a part of this patient's care.  Jordan Hawks Keiona Jenison 03/06/2022 6:29 PM

## 2022-03-07 ENCOUNTER — Inpatient Hospital Stay (HOSPITAL_COMMUNITY): Payer: Medicare Other

## 2022-03-07 ENCOUNTER — Other Ambulatory Visit (HOSPITAL_COMMUNITY): Payer: Self-pay | Admitting: *Deleted

## 2022-03-07 DIAGNOSIS — R7989 Other specified abnormal findings of blood chemistry: Secondary | ICD-10-CM | POA: Diagnosis not present

## 2022-03-07 DIAGNOSIS — K219 Gastro-esophageal reflux disease without esophagitis: Secondary | ICD-10-CM | POA: Diagnosis not present

## 2022-03-07 DIAGNOSIS — I4891 Unspecified atrial fibrillation: Secondary | ICD-10-CM | POA: Diagnosis not present

## 2022-03-07 LAB — CBC
HCT: 42.9 % (ref 39.0–52.0)
Hemoglobin: 14.1 g/dL (ref 13.0–17.0)
MCH: 29.7 pg (ref 26.0–34.0)
MCHC: 32.9 g/dL (ref 30.0–36.0)
MCV: 90.5 fL (ref 80.0–100.0)
Platelets: 297 10*3/uL (ref 150–400)
RBC: 4.74 MIL/uL (ref 4.22–5.81)
RDW: 12.5 % (ref 11.5–15.5)
WBC: 9.9 10*3/uL (ref 4.0–10.5)
nRBC: 0 % (ref 0.0–0.2)

## 2022-03-07 LAB — BASIC METABOLIC PANEL
Anion gap: 11 (ref 5–15)
BUN: 12 mg/dL (ref 6–20)
CO2: 28 mmol/L (ref 22–32)
Calcium: 8 mg/dL — ABNORMAL LOW (ref 8.9–10.3)
Chloride: 97 mmol/L — ABNORMAL LOW (ref 98–111)
Creatinine, Ser: 1.04 mg/dL (ref 0.61–1.24)
GFR, Estimated: >60 mL/min (ref >60)
Glucose, Bld: 143 mg/dL — ABNORMAL HIGH (ref 70–99)
Potassium: 3.4 mmol/L — ABNORMAL LOW (ref 3.5–5.1)
Sodium: 136 mmol/L (ref 135–145)

## 2022-03-07 LAB — MRSA NEXT GEN BY PCR, NASAL: MRSA by PCR Next Gen: NOT DETECTED

## 2022-03-07 LAB — LACTIC ACID, PLASMA: Lactic Acid, Venous: 1.9 mmol/L (ref 0.5–1.9)

## 2022-03-07 MED ORDER — ENOXAPARIN SODIUM 100 MG/ML IJ SOSY: 85.0000 mg | PREFILLED_SYRINGE | INTRAMUSCULAR | Status: DC

## 2022-03-07 MED ORDER — MELATONIN 3 MG PO TABS
6.0000 mg | ORAL_TABLET | Freq: Every day | ORAL | Status: DC
  Administered 2022-03-07: 6 mg via ORAL
  Filled 2022-03-07: qty 2

## 2022-03-07 MED ORDER — CHLORHEXIDINE GLUCONATE CLOTH 2 % EX PADS
6.0000 | MEDICATED_PAD | Freq: Every day | CUTANEOUS | Status: DC
  Administered 2022-03-07: 6 via TOPICAL

## 2022-03-07 MED ORDER — DILTIAZEM HCL 30 MG PO TABS
30.0000 mg | ORAL_TABLET | Freq: Four times a day (QID) | ORAL | Status: DC
  Administered 2022-03-07 (×3): 30 mg via ORAL
  Filled 2022-03-07 (×3): qty 1

## 2022-03-07 NOTE — Hospital Course (Signed)
46 y.o. male, past medical history of asthma, depression, anxiety, chronic gout, GERD, morbid obesity. -Patient presents secondary to complaints of right lower extremity erythema, pain and swelling, patient reports symptoms developed over last 4 days, he is unaware of any bite or scratch, patient reports history of travel to [place] a month ago, but reports flight was a short distance, he denies any chest pain, shortness of breath or hemoptysis, patient reports fever and chills at home which prompted him to come to ED, he was prescribed doxycycline 2 days ago by his PCP without much help. -In ED he was noticed to be tachycardic, he was noted to be in a flutter, heart rate in 170s, for which she started on Cardizem drip, ED discussed with cardiology, no indication for anticoagulation, patient was started on IV vancomycin and [facility] hospitalist consulted to admit.

## 2022-03-07 NOTE — Progress Notes (Signed)
Patient transferred up from ICU. Patient immediately want to speak to charge nurse, as patient stated he had been mistreated.  Patient had only been on floor less than ten minutes. Patient stated he had been mistreated by nurse who was helping him to transfer to floor.  Patient also wanted to know why he was moved from the ICU.  Patient insinuated move from the ICU because of the mistreatment from staff.  Explained to patient that he was no longer in the ICU because he had been weaned off of a cardiac drip and he was currently stable for our floor.  Patient then explained he was only here for his leg and not his heart.  Explained to patient because of his infection with his leg, his heart rate may have become irregular because of infection.  Patient then explained why was he just started on antibiotics.  Explained to patient he has been receiving antibiotics for his infections since admission.  Patient then stated he would rather be at [facility], because he felt safer there.  Explained to patient that transfer would not be possible because he was stable and already under the care of a physician receiving adequate treatment. Then explained to patient his only other option would be to leave AMA and drive himself to [facility]. Respiratory came in room to give flutter valve and patient given apple juice.  Notified AC of patient complaints, and will notify appropriate director of patient complaint .

## 2022-03-07 NOTE — Progress Notes (Signed)
PROGRESS NOTE   Martin Bruce  [id number] DOB: [birth year] DOA: 03/06/2022 PCP: Martin Spar, MD   Chief Complaint  Patient presents with   Leg Pain   Level of care: Telemetry  Brief Admission History:  46 y.o. male, past medical history of asthma, depression, anxiety, chronic gout, GERD, morbid obesity. -Patient presents secondary to complaints of right lower extremity erythema, pain and swelling, patient reports symptoms developed over last 4 days, he is unaware of any bite or scratch, patient reports history of travel to [place] a month ago, but reports flight was a short distance, he denies any chest pain, shortness of breath or hemoptysis, patient reports fever and chills at home which prompted him to come to ED, he was prescribed doxycycline 2 days ago by his PCP without much help. -In ED he was noticed to be tachycardic, he was noted to be in a flutter, heart rate in 170s, for which she started on Cardizem drip, ED discussed with cardiology, no indication for anticoagulation, patient was started on IV vancomycin and [facility] hospitalist consulted to admit.   Assessment and Plan:  A-fib with RVR -Is most likely provoked by his right lower extremity cellulitis, and severe hypokalemia -Currently heart rate controlled on Cardizem. -No indication for anticoagulation given CHA2DS2-VASc score of 0, continue baby aspirin -Check 2D echo -TSH within normal limits    Right lower extremity cellulitis - failed outpatient management -Dx significantly warm and tender, with significant erythema, will keep on IV vancomycin and cefepime specially with no much improvement on home doxycycline   Positive D-dimers -Mildly elevated at 1.5, but given his risk factors, and recent travel, will obtain venous Dopplers and CTA chest to rule out PE   Hypokalemia -Repleted,Following   Gout -Continue with home dose allopurinol   Morbid obesity - Body mass index is 48.79 kg/m.      Anxiety/depression/psychosis -Continue with home medications   GERD -Continue with PPI   DVT prophylaxis: enoxaparin Code Status: Full  Family Communication:  Disposition: Status is: Inpatient Remains inpatient appropriate because: IV antibiotics    Consultants:   Procedures:   Antimicrobials:  ceftriaxone  Subjective: Pt not cooperating with exam, says he is sleeping Objective: Vitals:   03/07/22 1000 03/07/22 1100 03/07/22 1144 03/07/22 1200  BP: 135/77 121/84 (!) 142/89 125/87  Pulse: 77 77  73  Resp: 12 15  20   Temp:  97.9 F (36.6 C)    TempSrc:  Oral    SpO2: 99% 99%  98%  Weight:      Height:        Intake/Output Summary (Last 24 hours) at 03/07/2022 1254 Last data filed at 03/07/2022 1100 Gross per 24 hour  Intake 2320.49 ml  Output 700 ml  Net 1620.49 ml   Filed Weights   03/06/22 1619 03/06/22 2146  Weight: (!) 172.4 kg (!) 172.3 kg   Examination:  General exam: Appears comfortable but not cooperating for exam.  Respiratory system: Clear to auscultation. Respiratory effort normal. Cardiovascular system: normal S1 & S2 heard. No JVD, murmurs, rubs, gallops or clicks. No pedal edema. Gastrointestinal system: Abdomen is nondistended, soft and nontender. No organomegaly or masses felt. Normal bowel sounds heard. Central nervous system: Alert and oriented. No focal neurological deficits. Extremities: Symmetric 5 x 5 power. Skin: No rashes, lesions or ulcers. Psychiatry: Judgement and insight appear poor. Mood & affect angry, agitated.   Data Reviewed: I have personally reviewed following labs and imaging studies  CBC: Recent Labs  Lab 03/06/22 1628 03/07/22 0118  WBC 9.3 9.9  NEUTROABS 5.7  --   HGB 15.5 14.1  HCT 46.1 42.9  MCV 88.1 90.5  PLT 309 [id number]    Basic Metabolic Panel: Recent Labs  Lab 03/06/22 1628 03/06/22 1700 03/07/22 0118  NA 136  --  136  K 2.8*  --  3.4*  CL 95*  --  97*  CO2 29  --  28  GLUCOSE 114*  --  143*  BUN  12  --  12  CREATININE 1.09  --  1.04  CALCIUM 8.4*  --  8.0*  MG  --  2.1  --     CBG: No results for input(s): "GLUCAP" in the last 168 hours.  Recent Results (from the past 240 hour(s))  Culture, blood (Routine x 2)     Status: None (Preliminary result)   Collection Time: 03/06/22  5:00 PM   Specimen: Left Antecubital; Blood  Result Value Ref Range Status   Specimen Description   Final    LEFT ANTECUBITAL BOTTLES DRAWN AEROBIC AND ANAEROBIC   Special Requests Blood Culture adequate volume  Final   Culture   Final    NO GROWTH < 12 HOURS Performed at [facility], [address] [place], [place] [address]    Report Status PENDING  Incomplete  Culture, blood (Routine x 2)     Status: None (Preliminary result)   Collection Time: 03/06/22  5:15 PM   Specimen: BLOOD LEFT HAND  Result Value Ref Range Status   Specimen Description   Final    BLOOD LEFT HAND BOTTLES DRAWN AEROBIC AND ANAEROBIC   Special Requests Blood Culture adequate volume  Final   Culture   Final    NO GROWTH < 12 HOURS Performed at [facility], [address], [place], [place] [address]    Report Status PENDING  Incomplete  MRSA Next Gen by PCR, Nasal     Status: None   Collection Time: 03/06/22  9:03 PM   Specimen: Nasal Mucosa; Nasal Swab  Result Value Ref Range Status   MRSA by PCR Next Gen NOT DETECTED NOT DETECTED Final    Comment: (NOTE) The GeneXpert MRSA Assay (FDA approved for NASAL specimens only), is one component of a comprehensive MRSA colonization surveillance program. It is not intended to diagnose MRSA infection nor to guide or monitor treatment for MRSA infections. Test performance is not FDA approved in patients less than 73 years old. Performed at [facility], [address], [place], [place] [address]      Radiology Studies: CT Angio Chest Pulmonary Embolism (PE) W or WO Contrast  Result Date: 03/07/2022 CLINICAL DATA:  Recent travel. Elevated D-dimer. Concern for pulmonary  embolus. EXAM: CT ANGIOGRAPHY CHEST WITH CONTRAST TECHNIQUE: Multidetector CT imaging of the chest was performed using the standard protocol during bolus administration of intravenous contrast. Multiplanar CT image reconstructions and MIPs were obtained to evaluate the vascular anatomy. RADIATION DOSE REDUCTION: This exam was performed according to the departmental dose-optimization program which includes automated exposure control, adjustment of the mA and/or kV according to patient size and/or use of iterative reconstruction technique. CONTRAST:  122mL OMNIPAQUE IOHEXOL 350 MG/ML SOLN COMPARISON:  Chest radiographs, 03/06/2022. FINDINGS: Cardiovascular: Pulmonary arteries are well opacified. Study mildly degraded by motion. There is no evidence of a pulmonary embolism. Heart is normal in size and configuration. No pericardial effusion. Normal great vessels. Mediastinum/Nodes: No enlarged mediastinal, hilar, or axillary lymph nodes. Thyroid gland, trachea, and  esophagus demonstrate no significant findings. Lungs/Pleura: Minor dependent subsegmental atelectasis. Lungs otherwise clear. No pleural effusion or pneumothorax. Upper Abdomen: Decreased liver attenuation consistent with fatty infiltration. No acute findings in the visualized upper abdomen. Musculoskeletal: No fracture or acute finding. No bone lesion. No chest wall mass. Review of the MIP images confirms the above findings. IMPRESSION: 1. No evidence of a pulmonary embolism. 2. No acute findings. Electronically Signed   By: Lajean Manes M.D.   On: 03/07/2022 11:21   US Venous Img Lower Unilateral Right (DVT)  Result Date: 03/07/2022 CLINICAL DATA:  Bilateral lower extremity swelling. Positive D-dimer. RIGHT EXAM: RIGHT LOWER EXTREMITY VENOUS DOPPLER ULTRASOUND TECHNIQUE: Gray-scale sonography with compression, as well as color and duplex ultrasound, were performed to evaluate the deep venous system(s) from the level of the common femoral vein through  the popliteal and proximal calf veins. COMPARISON:  None Available. FINDINGS: VENOUS Normal compressibility of the common femoral, superficial femoral, and popliteal veins, as well as the visualized calf veins. Visualized portions of profunda femoral vein and great saphenous vein unremarkable. No filling defects to suggest DVT on grayscale or color Doppler imaging. Doppler waveforms show normal direction of venous flow, normal respiratory plasticity and response to augmentation. Limited views of the contralateral common femoral vein are unremarkable. OTHER None. Limitations: none IMPRESSION: 1. No RIGHT lower extremity deep venous thrombosis. 2. Patient requested sonographer to NOT scan the LEFT lower extremity. Electronically Signed   By: Suzy Bouchard M.D.   On: 03/07/2022 10:53   DG Chest Port 1 View  Result Date: 03/06/2022 CLINICAL DATA:  tachycardia, leg swelling EXAM: PORTABLE CHEST 1 VIEW COMPARISON:  None Available. FINDINGS: Is Normal mediastinum and cardiac silhouette. Normal pulmonary vasculature. No evidence of effusion, infiltrate, or pneumothorax. No acute bony abnormality. IMPRESSION: No acute cardiopulmonary process. Electronically Signed   By: Suzy Bouchard M.D.   On: 03/06/2022 17:19    Scheduled Meds:  allopurinol  300 mg Oral Daily   aspirin EC  81 mg Oral Daily   Chlorhexidine Gluconate Cloth  6 each Topical Daily   diltiazem  30 mg Oral Q6H   enoxaparin (LOVENOX) injection  40 mg Subcutaneous Q24H   FLUoxetine  20 mg Oral Daily   gabapentin  300 mg Oral QHS   mometasone-formoterol  2 puff Inhalation BID   ziprasidone  60 mg Oral BID   Continuous Infusions:  sodium chloride 50 mL/hr at 03/07/22 0700   cefTRIAXone (ROCEPHIN)  IV     vancomycin 100 mL/hr at 03/07/22 0700     LOS: 1 day   Time spent: 35 mins  Yannet Rincon Wynetta Emery, MD How to contact the [facility] Attending or Consulting provider [place] or covering provider during after hours [place], for this patient?   Check the care team in [facility] and look for a) attending/consulting [facility] provider listed and b) the [facility] team listed Log into [email or web address] and use [place]'s universal password to access. If you do not have the password, please contact the hospital operator. Locate the [facility] provider you are looking for under [facility] and page to a number that you can be directly reached. If you still have difficulty reaching the provider, please page the [facility] (Director on Call) for the Hospitalists listed on amion for assistance.  03/07/2022, 12:54 PM

## 2022-03-07 NOTE — Progress Notes (Signed)
Nurse tech room came out and alerted staff that patient wanted staff to go to his car and retrieve medication from his car.

## 2022-03-07 NOTE — Progress Notes (Signed)
Patient left floor with 2 security guards and AC. Md aware.

## 2022-03-07 NOTE — Progress Notes (Signed)
Patient request PRN xanax. I made patient aware that xanax was administered at 1757 and Xanax couldn't be given at this time. Patient stated he need his Xanax. Alternative PRN option was offered and declined. HS meds was also refused. Patient stated, "He need something to help him sleep because he isn't going to lay here and stare at this walls." This Probation officer messaged the on-call MD and MD gave order for one time dose of Melatonin. Patient took meds then later stated, "Give me the papers to sign. I'm leaving now!!" IVs removed x2 and telemetry removed. Charge nurse and [facility] aware.

## 2022-03-07 NOTE — Progress Notes (Signed)
Echo attempted. Patient refused. Nurse notified.   Alvino Chapel, RCS

## 2022-03-08 NOTE — Discharge Summary (Signed)
Physician Discharge Summary  Martin Bruce [id number] DOB: November 08, 1975 DOA: 03/06/2022  PCP: Lindell Spar, MD  Admit date: 03/06/2022 Discharge date: 03/08/2022  PATIENT DISCHARGED AGAINST MEDICAL ADVICE CODE STATUS: FULL   DISCHARGE CONDITION: Guarded  Brief Hospitalization Summary: Please see all hospital notes, images, labs for full details of the hospitalization. ADMISSION HISTORY 46 y.o. male, past medical history of asthma, depression, anxiety, chronic gout, GERD, morbid obesity.  Patient presents secondary to complaints of right lower extremity erythema, pain and swelling, patient reports symptoms developed over last 4 days, he is unaware of any bite or scratch, patient reports history of travel to [place] a month ago, but reports flight was a short distance, he denies any chest pain, shortness of breath or hemoptysis, patient reports fever and chills at home which prompted him to come to ED, he was prescribed doxycycline 2 days ago by his PCP without much help. -In ED he was noticed to be tachycardic, he was noted to be in a flutter, heart rate in 170s, for which she started on Cardizem drip, ED discussed with cardiology, no indication for anticoagulation, patient was started on IV vancomycin and [facility] hospitalist consulted to admit.  Hospital Course:  Patient was admitted with atrial fibrillation with RVR and treated with IV Cardizem infusion.  He was managed initially in the stepdown ICU while he was on an IV Cardizem infusion.  He was also treated for right lower extremity cellulitis that failed outpatient management and required IV antibiotics he received vancomycin and cefepime.  He was ruled out for PE.  He refused to complete the lower extremity venous Doppler studies however the one that was completed did not show evidence of DVT.  Patient refused medications at times and refused treatments at times.  He was transition off the IV diltiazem infusion to oral diltiazem with good  rate control.  He was monitored for several more hours in the stepdown ICU and his heart rates remained stable and controlled.  He was transferred to telemetry unit for further monitoring.  Unfortunately patient decided to leave [place] after he arrived on the telemetry floor later in the evening.  When I arrived this morning to round on him I found out that he had left AGAINST MEDICAL ADVICE.  Unfortunately he is at high risk for adverse outcomes and rehospitalization.   Discharge Diagnoses:  Principal Problem:   Atrial fibrillation with RVR ([place]) Active Problems:   Gastroesophageal reflux disease   Anxiety with depression   Gout   Morbid obesity ([place])   Positive D dimer   Discharge Instructions: Discharge Instructions     Amb referral to [facility]   Complete by: As directed       Allergies as of 03/08/2022       Reactions   Penicillins Rash        Medication List     ASK your doctor about these medications    albuterol (2.5 MG/3ML) 0.083% nebulizer solution Commonly known as: PROVENTIL Take 2.5 mg by nebulization every 6 (six) hours as needed for wheezing or shortness of breath.   albuterol 108 (90 Base) MCG/ACT inhaler Commonly known as: VENTOLIN HFA INHALE 2 PUFFS BY MOUTH EVERY 6 HOURS AS NEEDED FOR WHEEZING FOR SHORTNESS OF BREATH   allopurinol 300 MG tablet Commonly known as: ZYLOPRIM Take 1 tablet (300 mg total) by mouth daily.   alprazolam 2 MG tablet Commonly known as: XANAX Take by mouth. Ask about: Which instructions should I  use?   budesonide-formoterol 160-4.5 MCG/ACT inhaler Commonly known as: SYMBICORT Inhale 2 puffs into the lungs 2 (two) times daily.   doxycycline 100 MG tablet Commonly known as: VIBRA-TABS Take 1 tablet (100 mg total) by mouth 2 (two) times daily for 6 days.   FLUoxetine 20 MG capsule Commonly known as: PROZAC Take 20 mg by mouth daily. Ask about: Which instructions should I use?   gabapentin 300 MG  capsule Commonly known as: NEURONTIN Take 1 capsule (300 mg total) by mouth at bedtime.   HYDROcodone-acetaminophen 7.5-325 MG tablet Commonly known as: Norco Take 1 tablet by mouth every 6 (six) hours as needed for moderate pain (Must last 30 days.).   hydrOXYzine 50 MG capsule Commonly known as: VISTARIL Take 100 mg by mouth every 8 (eight) hours as needed.   ondansetron 4 MG disintegrating tablet Commonly known as: ZOFRAN-ODT Take 4 mg by mouth every 8 (eight) hours as needed.   ziprasidone 60 MG capsule Commonly known as: GEODON Take 60 mg by mouth 2 (two) times daily.        Allergies  Allergen Reactions   Penicillins Rash   Allergies as of 03/08/2022       Reactions   Penicillins Rash        Medication List     ASK your doctor about these medications    albuterol (2.5 MG/3ML) 0.083% nebulizer solution Commonly known as: PROVENTIL Take 2.5 mg by nebulization every 6 (six) hours as needed for wheezing or shortness of breath.   albuterol 108 (90 Base) MCG/ACT inhaler Commonly known as: VENTOLIN HFA INHALE 2 PUFFS BY MOUTH EVERY 6 HOURS AS NEEDED FOR WHEEZING FOR SHORTNESS OF BREATH   allopurinol 300 MG tablet Commonly known as: ZYLOPRIM Take 1 tablet (300 mg total) by mouth daily.   alprazolam 2 MG tablet Commonly known as: XANAX Take by mouth. Ask about: Which instructions should I use?   budesonide-formoterol 160-4.5 MCG/ACT inhaler Commonly known as: SYMBICORT Inhale 2 puffs into the lungs 2 (two) times daily.   doxycycline 100 MG tablet Commonly known as: VIBRA-TABS Take 1 tablet (100 mg total) by mouth 2 (two) times daily for 6 days.   FLUoxetine 20 MG capsule Commonly known as: PROZAC Take 20 mg by mouth daily. Ask about: Which instructions should I use?   gabapentin 300 MG capsule Commonly known as: NEURONTIN Take 1 capsule (300 mg total) by mouth at bedtime.   HYDROcodone-acetaminophen 7.5-325 MG tablet Commonly known as:  Norco Take 1 tablet by mouth every 6 (six) hours as needed for moderate pain (Must last 30 days.).   hydrOXYzine 50 MG capsule Commonly known as: VISTARIL Take 100 mg by mouth every 8 (eight) hours as needed.   ondansetron 4 MG disintegrating tablet Commonly known as: ZOFRAN-ODT Take 4 mg by mouth every 8 (eight) hours as needed.   ziprasidone 60 MG capsule Commonly known as: GEODON Take 60 mg by mouth 2 (two) times daily.        Procedures/Studies: CT Angio Chest Pulmonary Embolism (PE) W or WO Contrast  Result Date: 03/07/2022 CLINICAL DATA:  Recent travel. Elevated D-dimer. Concern for pulmonary embolus. EXAM: CT ANGIOGRAPHY CHEST WITH CONTRAST TECHNIQUE: Multidetector CT imaging of the chest was performed using the standard protocol during bolus administration of intravenous contrast. Multiplanar CT image reconstructions and MIPs were obtained to evaluate the vascular anatomy. RADIATION DOSE REDUCTION: This exam was performed according to the departmental dose-optimization program which includes automated exposure control, adjustment of the  mA and/or kV according to patient size and/or use of iterative reconstruction technique. CONTRAST:  171mL OMNIPAQUE IOHEXOL 350 MG/ML SOLN COMPARISON:  Chest radiographs, 03/06/2022. FINDINGS: Cardiovascular: Pulmonary arteries are well opacified. Study mildly degraded by motion. There is no evidence of a pulmonary embolism. Heart is normal in size and configuration. No pericardial effusion. Normal great vessels. Mediastinum/Nodes: No enlarged mediastinal, hilar, or axillary lymph nodes. Thyroid gland, trachea, and esophagus demonstrate no significant findings. Lungs/Pleura: Minor dependent subsegmental atelectasis. Lungs otherwise clear. No pleural effusion or pneumothorax. Upper Abdomen: Decreased liver attenuation consistent with fatty infiltration. No acute findings in the visualized upper abdomen. Musculoskeletal: No fracture or acute finding. No  bone lesion. No chest wall mass. Review of the MIP images confirms the above findings. IMPRESSION: 1. No evidence of a pulmonary embolism. 2. No acute findings. Electronically Signed   By: Lajean Manes M.D.   On: 03/07/2022 11:21   US Venous Img Lower Unilateral Right (DVT)  Result Date: 03/07/2022 CLINICAL DATA:  Bilateral lower extremity swelling. Positive D-dimer. RIGHT EXAM: RIGHT LOWER EXTREMITY VENOUS DOPPLER ULTRASOUND TECHNIQUE: Gray-scale sonography with compression, as well as color and duplex ultrasound, were performed to evaluate the deep venous system(s) from the level of the common femoral vein through the popliteal and proximal calf veins. COMPARISON:  None Available. FINDINGS: VENOUS Normal compressibility of the common femoral, superficial femoral, and popliteal veins, as well as the visualized calf veins. Visualized portions of profunda femoral vein and great saphenous vein unremarkable. No filling defects to suggest DVT on grayscale or color Doppler imaging. Doppler waveforms show normal direction of venous flow, normal respiratory plasticity and response to augmentation. Limited views of the contralateral common femoral vein are unremarkable. OTHER None. Limitations: none IMPRESSION: 1. No RIGHT lower extremity deep venous thrombosis. 2. Patient requested sonographer to NOT scan the LEFT lower extremity. Electronically Signed   By: Suzy Bouchard M.D.   On: 03/07/2022 10:53   DG Chest Port 1 View  Result Date: 03/06/2022 CLINICAL DATA:  tachycardia, leg swelling EXAM: PORTABLE CHEST 1 VIEW COMPARISON:  None Available. FINDINGS: Is Normal mediastinum and cardiac silhouette. Normal pulmonary vasculature. No evidence of effusion, infiltrate, or pneumothorax. No acute bony abnormality. IMPRESSION: No acute cardiopulmonary process. Electronically Signed   By: Suzy Bouchard M.D.   On: 03/06/2022 17:19     Discharge Exam: Vitals:   03/07/22 1941 03/07/22 2129  BP:  (!) 109/48   Pulse:  88  Resp:  18  Temp:  98.2 F (36.8 C)  SpO2: 96% 98%   Vitals:   03/07/22 1200 03/07/22 1500 03/07/22 1941 03/07/22 2129  BP: 125/87   (!) 109/48  Pulse: 73   88  Resp: 20   18  Temp:  98 F (36.7 C)  98.2 F (36.8 C)  TempSrc:  Oral  Oral  SpO2: 98%  96% 98%  Weight:      Height:         The results of significant diagnostics from this hospitalization (including imaging, microbiology, ancillary and laboratory) are listed below for reference.     Microbiology: Recent Results (from the past 240 hour(s))  Culture, blood (Routine x 2)     Status: None (Preliminary result)   Collection Time: 03/06/22  5:00 PM   Specimen: Left Antecubital; Blood  Result Value Ref Range Status   Specimen Description   Final    LEFT ANTECUBITAL BOTTLES DRAWN AEROBIC AND ANAEROBIC   Special Requests Blood Culture adequate volume  Final  Culture   Final    NO GROWTH 2 DAYS Performed at [facility], [address] [place], [place] [address]    Report Status PENDING  Incomplete  Culture, blood (Routine x 2)     Status: None (Preliminary result)   Collection Time: 03/06/22  5:15 PM   Specimen: BLOOD LEFT HAND  Result Value Ref Range Status   Specimen Description   Final    BLOOD LEFT HAND BOTTLES DRAWN AEROBIC AND ANAEROBIC   Special Requests Blood Culture adequate volume  Final   Culture   Final    NO GROWTH 2 DAYS Performed at [facility], [address], [place], [place] [address]    Report Status PENDING  Incomplete  MRSA Next Gen by PCR, Nasal     Status: None   Collection Time: 03/06/22  9:03 PM   Specimen: Nasal Mucosa; Nasal Swab  Result Value Ref Range Status   MRSA by PCR Next Gen NOT DETECTED NOT DETECTED Final    Comment: (NOTE) The GeneXpert MRSA Assay (FDA approved for NASAL specimens only), is one component of a comprehensive MRSA colonization surveillance program. It is not intended to diagnose MRSA infection nor to guide or monitor treatment for MRSA  infections. Test performance is not FDA approved in patients less than 49 years old. Performed at [facility], [address], [place], [place] [address]      Labs: BNP (last 3 results) Recent Labs    03/06/22 1649  BNP 99991111   Basic Metabolic Panel: Recent Labs  Lab 03/06/22 1628 03/06/22 1700 03/07/22 0118  NA 136  --  136  K 2.8*  --  3.4*  CL 95*  --  97*  CO2 29  --  28  GLUCOSE 114*  --  143*  BUN 12  --  12  CREATININE 1.09  --  1.04  CALCIUM 8.4*  --  8.0*  MG  --  2.1  --    Liver Function Tests: Recent Labs  Lab 03/06/22 1628  AST 25  ALT 24  ALKPHOS 92  BILITOT 1.0  PROT 7.8  ALBUMIN 3.0*   No results for input(s): "LIPASE", "AMYLASE" in the last 168 hours. No results for input(s): "AMMONIA" in the last 168 hours. CBC: Recent Labs  Lab 03/06/22 1628 03/07/22 0118  WBC 9.3 9.9  NEUTROABS 5.7  --   HGB 15.5 14.1  HCT 46.1 42.9  MCV 88.1 90.5  PLT 309 297   Cardiac Enzymes: No results for input(s): "CKTOTAL", "CKMB", "CKMBINDEX", "TROPONINI" in the last 168 hours. BNP: Invalid input(s): "POCBNP" CBG: No results for input(s): "GLUCAP" in the last 168 hours. D-Dimer Recent Labs    03/06/22 1628  DDIMER 1.48*   Hgb A1c No results for input(s): "HGBA1C" in the last 72 hours. Lipid Profile No results for input(s): "CHOL", "HDL", "LDLCALC", "TRIG", "CHOLHDL", "LDLDIRECT" in the last 72 hours. Thyroid function studies Recent Labs    03/06/22 1649  TSH 1.467   Anemia work up No results for input(s): "VITAMINB12", "FOLATE", "FERRITIN", "TIBC", "IRON", "RETICCTPCT" in the last 72 hours. Urinalysis    Component Value Date/Time   COLORURINE YELLOW 06/07/2019 [place] 06/07/2019 1244   LABSPEC >1.046 (H) 06/07/2019 [place] 5.0 06/07/2019 1244   [place] 06/07/2019 [place] 06/07/2019 [place] 06/07/2019 1244   KETONESUR 5 (A) 06/07/2019 1244   PROTEINUR NEGATIVE  06/07/2019 1244   NITRITE NEGATIVE 06/07/2019  [place] (A) 06/07/2019 1244   Sepsis Labs Recent Labs  Lab 03/06/22 1628 03/07/22 0118  WBC 9.3 9.9   Microbiology Recent Results (from the past 240 hour(s))  Culture, blood (Routine x 2)     Status: None (Preliminary result)   Collection Time: 03/06/22  5:00 PM   Specimen: Left Antecubital; Blood  Result Value Ref Range Status   Specimen Description   Final    LEFT ANTECUBITAL BOTTLES DRAWN AEROBIC AND ANAEROBIC   Special Requests Blood Culture adequate volume  Final   Culture   Final    NO GROWTH 2 DAYS Performed at [facility], [address], [place], [place] [address]    Report Status PENDING  Incomplete  Culture, blood (Routine x 2)     Status: None (Preliminary result)   Collection Time: 03/06/22  5:15 PM   Specimen: BLOOD LEFT HAND  Result Value Ref Range Status   Specimen Description   Final    BLOOD LEFT HAND BOTTLES DRAWN AEROBIC AND ANAEROBIC   Special Requests Blood Culture adequate volume  Final   Culture   Final    NO GROWTH 2 DAYS Performed at [facility], [address], [place], [place] [address]    Report Status PENDING  Incomplete  MRSA Next Gen by PCR, Nasal     Status: None   Collection Time: 03/06/22  9:03 PM   Specimen: Nasal Mucosa; Nasal Swab  Result Value Ref Range Status   MRSA by PCR Next Gen NOT DETECTED NOT DETECTED Final    Comment: (NOTE) The GeneXpert MRSA Assay (FDA approved for NASAL specimens only), is one component of a comprehensive MRSA colonization surveillance program. It is not intended to diagnose MRSA infection nor to guide or monitor treatment for MRSA infections. Test performance is not FDA approved in patients less than 70 years old. Performed at [facility], [address], [place], [place] [address]     Time coordinating discharge:   SIGNED:  Irwin Brakeman, MD  [facility] 03/08/2022, 8:22 AM How to contact the [facility] Attending or  Consulting provider [place] or covering provider during after hours [place], for this patient?  Check the care team in [facility] and look for a) attending/consulting [facility] provider listed and b) the [facility] team listed Log into [email or web address] and use [place]'s universal password to access. If you do not have the password, please contact the hospital operator. Locate the [facility] provider you are looking for under [facility] and page to a number that you can be directly reached. If you still have difficulty reaching the provider, please page the [facility] (Director on Call) for the Hospitalists listed on amion for assistance.

## 2022-03-09 ENCOUNTER — Telehealth: Payer: Self-pay | Admitting: *Deleted

## 2022-03-09 NOTE — Telephone Encounter (Signed)
Called pt to schedule a TOC since hospital stay 03-08-22 he said he did not want to follow up with anyone in [place] after the way he was treated at [facility] let him know that I was calling from Dr Serita Grit office not [facility] Na he asked if Dr Posey Pronto was part of [facility]. I explained [place] had many facilities and he asked was Dr Posey Pronto under [facility]. I said yes. Patient said "GO TO HELL" and hung up the phone

## 2022-03-11 ENCOUNTER — Encounter: Payer: Self-pay | Admitting: Internal Medicine

## 2022-03-11 ENCOUNTER — Inpatient Hospital Stay: Payer: Medicare Other | Admitting: Internal Medicine

## 2022-03-11 ENCOUNTER — Other Ambulatory Visit: Payer: Self-pay | Admitting: Radiology

## 2022-03-11 LAB — CULTURE, BLOOD (ROUTINE X 2)
Culture: NO GROWTH
Culture: NO GROWTH
Special Requests: ADEQUATE
Special Requests: ADEQUATE

## 2022-03-11 MED ORDER — HYDROCODONE-ACETAMINOPHEN 7.5-325 MG PO TABS: 1.0000 | ORAL_TABLET | Freq: Four times a day (QID) | ORAL | 0 refills | Status: DC | PRN

## 2022-03-11 NOTE — Telephone Encounter (Signed)
Patient called, said he was just discharged from hospital with edema in his legs.  He doesn't know with whom or when he should follow up for it.    He also asks for a refill of hydrocodone.

## 2022-03-11 NOTE — Telephone Encounter (Signed)
I called patient had to [facility] advised Rx sent.  And to call his PCP or see who hospitalist wanted him to see, or go to ED for further treatment of the cellulitis.

## 2022-03-14 ENCOUNTER — Emergency Department (HOSPITAL_COMMUNITY)
Admission: EM | Admit: 2022-03-14 | Discharge: 2022-03-14 | Disposition: A | Discharge status: Home / Self Care | Payer: Medicare Other | Attending: Emergency Medicine | Admitting: Emergency Medicine

## 2022-03-14 ENCOUNTER — Emergency Department (HOSPITAL_COMMUNITY): Payer: Medicare Other

## 2022-03-14 ENCOUNTER — Encounter (HOSPITAL_COMMUNITY): Payer: Self-pay | Admitting: Emergency Medicine

## 2022-03-14 ENCOUNTER — Other Ambulatory Visit: Payer: Self-pay

## 2022-03-14 DIAGNOSIS — L03115 Cellulitis of right lower limb: Secondary | ICD-10-CM

## 2022-03-14 DIAGNOSIS — M7989 Other specified soft tissue disorders: Secondary | ICD-10-CM | POA: Diagnosis present

## 2022-03-14 DIAGNOSIS — R600 Localized edema: Secondary | ICD-10-CM | POA: Diagnosis not present

## 2022-03-14 LAB — COMPREHENSIVE METABOLIC PANEL
ALT: 27 U/L (ref 0–44)
AST: 27 U/L (ref 15–41)
Albumin: 2.9 g/dL — ABNORMAL LOW (ref 3.5–5.0)
Alkaline Phosphatase: 88 U/L (ref 38–126)
Anion gap: 13 (ref 5–15)
BUN: 11 mg/dL (ref 6–20)
CO2: 24 mmol/L (ref 22–32)
Calcium: 9 mg/dL (ref 8.9–10.3)
Chloride: 97 mmol/L — ABNORMAL LOW (ref 98–111)
Creatinine, Ser: 1.16 mg/dL (ref 0.61–1.24)
GFR, Estimated: >60 mL/min (ref >60)
Glucose, Bld: 100 mg/dL — ABNORMAL HIGH (ref 70–99)
Potassium: 4.3 mmol/L (ref 3.5–5.1)
Sodium: 134 mmol/L — ABNORMAL LOW (ref 135–145)
Total Bilirubin: 1 mg/dL (ref 0.3–1.2)
Total Protein: 8 g/dL (ref 6.5–8.1)

## 2022-03-14 LAB — CBC WITH DIFFERENTIAL/PLATELET
Abs Immature Granulocytes: 0.03 10*3/uL (ref 0.00–0.07)
Basophils Absolute: 0.1 10*3/uL (ref 0.0–0.1)
Basophils Relative: 1 %
Eosinophils Absolute: 0.2 10*3/uL (ref 0.0–0.5)
Eosinophils Relative: 2 %
HCT: 41.9 % (ref 39.0–52.0)
Hemoglobin: 14 g/dL (ref 13.0–17.0)
Immature Granulocytes: 0 %
Lymphocytes Relative: 35 %
Lymphs Abs: 2.9 10*3/uL (ref 0.7–4.0)
MCH: 30.4 pg (ref 26.0–34.0)
MCHC: 33.4 g/dL (ref 30.0–36.0)
MCV: 90.9 fL (ref 80.0–100.0)
Monocytes Absolute: 0.6 10*3/uL (ref 0.1–1.0)
Monocytes Relative: 7 %
Neutro Abs: 4.6 10*3/uL (ref 1.7–7.7)
Neutrophils Relative %: 55 %
Platelets: 375 10*3/uL (ref 150–400)
RBC: 4.61 MIL/uL (ref 4.22–5.81)
RDW: 12.4 % (ref 11.5–15.5)
WBC: 8.4 10*3/uL (ref 4.0–10.5)
nRBC: 0 % (ref 0.0–0.2)

## 2022-03-14 LAB — BRAIN NATRIURETIC PEPTIDE: B Natriuretic Peptide: 9.8 pg/mL (ref 0.0–100.0)

## 2022-03-14 LAB — LACTIC ACID, PLASMA: Lactic Acid, Venous: 1.7 mmol/L (ref 0.5–1.9)

## 2022-03-14 MED ORDER — FUROSEMIDE 20 MG PO TABS: 20.0000 mg | ORAL_TABLET | ORAL | 0 refills | Status: AC

## 2022-03-14 MED ORDER — IOHEXOL 350 MG/ML SOLN
75.0000 mL | Freq: Once | INTRAVENOUS | Status: AC | PRN
  Administered 2022-03-14: 75 mL via INTRAVENOUS

## 2022-03-14 MED ORDER — CEPHALEXIN 500 MG PO CAPS: 500.0000 mg | ORAL_CAPSULE | Freq: Four times a day (QID) | ORAL | 0 refills | Status: AC

## 2022-03-14 NOTE — ED Provider Notes (Signed)
  Physical Exam  BP 112/69 (BP Location: Right Arm)   Pulse 80   Temp 98.4 F (36.9 C) (Oral)   Resp 12   SpO2 99%   Physical Exam  Procedures  Procedures  ED Course / MDM   Clinical Course as of 03/14/22 1725  Sat Mar 14, 2022  0803 Assumed care from Dr. Betsey Holiday.  46 year old male with a history of chronic right lower extremity swelling who presents with redness of his right lower extremity concerning for possible cellulitis versus venous stasis.  Not septic at this time.  Obtaining a CT of the lower extremity with contrast to evaluate for deeper infection.  If negative will discharge home with antibiotics and PCP follow-up. [RP]  [id number] CT scan reviewed and interpreted by me and does not show any evidence of deep space infection or free air to be concerning for abscess or necrotizing fasciitis. [RP]  9390 CT scan final read was consistent with possible lower extremity edema versus cellulitis without any deeper space infection.  Talk to the patient about his symptoms and explained how this could potentially be persistent infection with strep species versus chronic venous stasis.  Patient was given a prescription of Keflex and also a short course of Lasix to see if this should improve his swelling.  Also instructed to wear compression stockings and follow-up with his primary doctor in several days.  Return precautions discussed prior to discharge. [RP]    Clinical Course User Index [RP] Fransico Meadow, MD   Medical Decision Making Amount and/or Complexity of Data Reviewed Labs: ordered. Radiology: ordered.  Risk Prescription drug management.      Fransico Meadow, MD 03/14/22 1725

## 2022-03-14 NOTE — ED Triage Notes (Signed)
Pt reported to ED for evaluation of cellulitis to RLE. Pt states it has remained swollen and painful despite oral antibiotic treatment. States symptoms haven been ongoing for a little over one week.

## 2022-03-14 NOTE — ED Notes (Signed)
Patient transported to CT 

## 2022-03-14 NOTE — Discharge Instructions (Addendum)
Today you were seen in the emergency department for your leg swelling and infection.    In the emergency department you had a CT scan that did not show deep infection of your leg.    At home, please take the Keflex we have prescribed you as needed for your infection.  Please also take the Lasix every other day that we have prescribed you for any leg swelling that you may have and also use compression socks or stockings for leg swelling.    Check your MyChart online for the results of any tests that had not resulted by the time you left the emergency department.   Follow-up with your primary doctor in 2-3 days regarding your visit.    Return immediately to the emergency department if you experience any of the following: Fevers, worsening redness or swelling, or any other concerning symptoms.    Thank you for visiting our Emergency Department. It was a pleasure taking care of you today.

## 2022-03-14 NOTE — ED Provider Notes (Signed)
[facility] EMERGENCY DEPARTMENT Provider Note   CSN: [id number] Arrival date & time: 03/14/22  0455     History  Chief Complaint  Patient presents with   Cellulitis    Martin Bruce is a 46 y.o. male.  Patient presents to the emergency department for evaluation of persistent pain and swelling of his right leg.  He reports has been going on for more than a week.       Home Medications Prior to Admission medications   Medication Sig Start Date End Date Taking? Authorizing Provider  albuterol (PROVENTIL) (2.5 MG/3ML) 0.083% nebulizer solution Take 2.5 mg by nebulization every 6 (six) hours as needed for wheezing or shortness of breath.    [provider]  albuterol (VENTOLIN HFA) 108 (90 Base) MCG/ACT inhaler INHALE 2 PUFFS BY MOUTH EVERY 6 HOURS AS NEEDED FOR WHEEZING FOR SHORTNESS OF BREATH 02/27/22   Lindell Spar, MD  allopurinol (ZYLOPRIM) 300 MG tablet Take 1 tablet (300 mg total) by mouth daily. 06/25/21   Sanjuana Kava, MD  alprazolam Duanne Moron) 2 MG tablet Take by mouth.    [provider]  budesonide-formoterol (SYMBICORT) 160-4.5 MCG/ACT inhaler Inhale 2 puffs into the lungs 2 (two) times daily. 01/08/22   Lindell Spar, MD  FLUoxetine (PROZAC) 20 MG capsule Take 20 mg by mouth daily.    [provider]  gabapentin (NEURONTIN) 300 MG capsule Take 1 capsule (300 mg total) by mouth at bedtime. 10/28/21   Fayrene Helper, MD  HYDROcodone-acetaminophen (NORCO) 7.5-325 MG tablet Take 1 tablet by mouth every 6 (six) hours as needed for moderate pain (Must last 30 days.). 03/11/22   Sanjuana Kava, MD  hydrOXYzine (VISTARIL) 50 MG capsule Take 100 mg by mouth every 8 (eight) hours as needed. 01/20/21   [provider]  ondansetron (ZOFRAN-ODT) 4 MG disintegrating tablet Take 4 mg by mouth every 8 (eight) hours as needed. 03/03/22   [provider]  ziprasidone (GEODON) 60 MG capsule Take 60 mg by mouth 2 (two) times  daily. 01/30/22   [provider]      Allergies    Penicillins    Review of Systems   Review of Systems  Physical Exam Updated Vital Signs BP 119/86   Pulse 81   Temp 98.2 F (36.8 C) (Oral)   Resp 19   SpO2 98%  Physical Exam Vitals and nursing note reviewed.  Constitutional:      General: He is not in acute distress.    Appearance: He is well-developed.  HENT:     Head: Normocephalic and atraumatic.     Mouth/Throat:     Mouth: Mucous membranes are moist.  Eyes:     General: Vision grossly intact. Gaze aligned appropriately.     Extraocular Movements: Extraocular movements intact.     Conjunctiva/sclera: Conjunctivae normal.  Cardiovascular:     Rate and Rhythm: Normal rate and regular rhythm.     Pulses: Normal pulses.     Heart sounds: Normal heart sounds, S1 normal and S2 normal. No murmur heard.    No friction rub. No gallop.  Pulmonary:     Effort: Pulmonary effort is normal. No respiratory distress.     Breath sounds: Normal breath sounds.  Abdominal:     Palpations: Abdomen is soft.     Tenderness: There is no abdominal tenderness. There is no guarding or rebound.     Hernia: No hernia is present.  Musculoskeletal:  General: No swelling.     Cervical back: Full passive range of motion without pain, normal range of motion and neck supple. No pain with movement, spinous process tenderness or muscular tenderness. Normal range of motion.     Right lower leg: Edema present.     Left lower leg: No edema.  Skin:    General: Skin is warm and dry.     Capillary Refill: Capillary refill takes less than 2 seconds.     Findings: Erythema present. No ecchymosis, lesion or wound.  Neurological:     Mental Status: He is alert and oriented to person, place, and time.     GCS: GCS eye subscore is 4. GCS verbal subscore is 5. GCS motor subscore is 6.     Cranial Nerves: Cranial nerves 2-12 are intact.     Sensory: Sensation is intact.     Motor: Motor  function is intact. No weakness or abnormal muscle tone.     Coordination: Coordination is intact.  Psychiatric:        Mood and Affect: Mood normal.        Speech: Speech normal.        Behavior: Behavior normal.     ED Results / Procedures / Treatments   Labs (all labs ordered are listed, but only abnormal results are displayed) Labs Reviewed  COMPREHENSIVE METABOLIC PANEL - Abnormal; Notable for the following components:      Result Value   Sodium 134 (*)    Chloride 97 (*)    Glucose, Bld 100 (*)    Albumin 2.9 (*)    All other components within normal limits  CBC WITH DIFFERENTIAL/PLATELET  LACTIC ACID, PLASMA  BRAIN NATRIURETIC PEPTIDE    EKG None  Radiology No results found.  Procedures Procedures    Medications Ordered in ED Medications - No data to display  ED Course/ Medical Decision Making/ A&P                           Medical Decision Making Amount and/or Complexity of Data Reviewed Labs: ordered. Radiology: ordered.   Patient with complaints of persistent pain and swelling of right leg.  Records reviewed indicates that he was admitted to [facility] and left AMA.  He then presented to [facility] and was admitted to the hospital there, treated for cellulitis.  DVT ruled out.  He was discharged on doxycycline.  He reports that he took his last dose today.  Examination reveals swelling of the right leg, more than the left.  There is some erythema around the ankle area but no warmth.  There is also some induration medially, in the proximal calf area.  Patient has a great deal of tenderness in this area as well.  Blood work is reassuring.  Patient has bounding pulses, no concern for arterial occlusion.  We will perform CT to evaluate for deeper infection.  Suspect that the erythema is secondary to the swelling and not active infection.  Will sign out to oncoming ER physician to follow-up on results.        Final Clinical Impression(s) /  ED Diagnoses Final diagnoses:  Leg edema, right    Rx / DC Orders ED Discharge Orders     None         Rohaan Durnil, Gwenyth Allegra, MD 03/14/22 [phone]

## 2022-04-08 NOTE — Progress Notes (Unsigned)
This encounter was created in error - please disregard.

## 2022-04-09 ENCOUNTER — Telehealth: Payer: Self-pay | Admitting: Radiology

## 2022-04-09 MED ORDER — HYDROCODONE-ACETAMINOPHEN 7.5-325 MG PO TABS: 1.0000 | ORAL_TABLET | Freq: Four times a day (QID) | ORAL | 0 refills | Status: DC | PRN

## 2022-04-15 ENCOUNTER — Encounter: Payer: Medicare Other | Admitting: Internal Medicine

## 2022-04-23 ENCOUNTER — Other Ambulatory Visit: Payer: Self-pay | Admitting: Internal Medicine

## 2022-04-23 DIAGNOSIS — J4541 Moderate persistent asthma with (acute) exacerbation: Secondary | ICD-10-CM

## 2022-05-03 ENCOUNTER — Other Ambulatory Visit: Payer: Self-pay | Admitting: Internal Medicine

## 2022-05-03 DIAGNOSIS — J4541 Moderate persistent asthma with (acute) exacerbation: Secondary | ICD-10-CM

## 2022-05-11 ENCOUNTER — Telehealth: Payer: Self-pay

## 2022-05-11 ENCOUNTER — Other Ambulatory Visit: Payer: Self-pay

## 2022-05-11 ENCOUNTER — Telehealth: Payer: Self-pay | Admitting: Radiology

## 2022-05-11 MED ORDER — HYDROCODONE-ACETAMINOPHEN 7.5-325 MG PO TABS: 1.0000 | ORAL_TABLET | Freq: Four times a day (QID) | ORAL | 0 refills | Status: DC | PRN

## 2022-05-11 NOTE — Telephone Encounter (Signed)
Patient called, asked for refill hydrocodone to [facility].

## 2022-05-11 NOTE — Telephone Encounter (Signed)
Sent to provider 

## 2022-05-26 ENCOUNTER — Ambulatory Visit: Payer: Medicare Other | Admitting: Orthopaedic Surgery

## 2022-05-26 ENCOUNTER — Encounter: Payer: Self-pay | Admitting: Orthopaedic Surgery

## 2022-05-26 DIAGNOSIS — M545 Low back pain, unspecified: Secondary | ICD-10-CM

## 2022-05-26 NOTE — Patient Instructions (Addendum)
FOLLOW UP: 3 MTHS LBP  CALL us IF YOU NEED [place] BEFORE THEN  YOU CAN READ UP ON CELLULITIS ON [email or web address]  DISCUSS UPDATING YOUR TETANUS VACCINE WITH YOUR FAMILY DOCTOR

## 2022-05-26 NOTE — Progress Notes (Signed)
He has chronic pain of the lower back with good and bad days.  He was hospitalized recently with cellulitis.  He is doing well now.   He has pain in the back on colder and rainy days.  He has no new trauma.  He is taking his medicine.  Spine/Pelvis examination:  Inspection:  Overall, sacoiliac joint benign and hips nontender; without crepitus or defects.   Thoracic spine inspection: Alignment normal without kyphosis present   Lumbar spine inspection:  Alignment  with normal lumbar lordosis, without scoliosis apparent.   Thoracic spine palpation:  without tenderness of spinal processes   Lumbar spine palpation: without tenderness of lumbar area; without tightness of lumbar muscles    Range of Motion:   Lumbar flexion, forward flexion is normal without pain or tenderness    Lumbar extension is full without pain or tenderness   Left lateral bend is normal without pain or tenderness   Right lateral bend is normal without pain or tenderness   Straight leg raising is normal  Strength & tone: normal   Stability overall normal stability Encounter Diagnosis  Name Primary?   Lumbar pain Yes   Return in three months.  Call if any problem.  Precautions discussed.  Electronically Signed Darreld Mclean, MD 12/19/20231:47 PM

## 2022-06-04 NOTE — Telephone Encounter (Signed)
Done

## 2022-06-05 ENCOUNTER — Other Ambulatory Visit: Payer: Self-pay | Admitting: Internal Medicine

## 2022-06-05 DIAGNOSIS — J4541 Moderate persistent asthma with (acute) exacerbation: Secondary | ICD-10-CM

## 2022-06-09 ENCOUNTER — Telehealth: Payer: Self-pay | Admitting: Orthopaedic Surgery

## 2022-06-09 MED ORDER — HYDROCODONE-ACETAMINOPHEN 7.5-325 MG PO TABS: 1.0000 | ORAL_TABLET | Freq: Four times a day (QID) | ORAL | 0 refills | Status: DC | PRN

## 2022-06-09 NOTE — Telephone Encounter (Signed)
Patient called, requesting a refill on Hydrocodone 7.5-325 to be sent to [facility].  Pt's # [phone]

## 2022-07-13 ENCOUNTER — Telehealth: Payer: Self-pay | Admitting: Orthopaedic Surgery

## 2022-07-13 MED ORDER — HYDROCODONE-ACETAMINOPHEN 7.5-325 MG PO TABS: 1.0000 | ORAL_TABLET | Freq: Four times a day (QID) | ORAL | 0 refills | Status: DC | PRN

## 2022-07-13 NOTE — Telephone Encounter (Signed)
Patient called, lvm requesting a refill for Hydrocodone 7.5-325mg  to [facility].

## 2022-08-06 ENCOUNTER — Encounter: Payer: Self-pay | Admitting: Radiology

## 2022-08-06 ENCOUNTER — Telehealth: Payer: Self-pay

## 2022-08-06 NOTE — Telephone Encounter (Signed)
Hydrocodone-Acetaminophen 7.5/325 MG Qty 100 [place]  PATIENT USES [place] [facility]

## 2022-08-09 MED ORDER — HYDROCODONE-ACETAMINOPHEN 7.5-325 MG PO TABS: 1.0000 | ORAL_TABLET | Freq: Four times a day (QID) | ORAL | 0 refills | Status: DC | PRN

## 2022-08-13 ENCOUNTER — Telehealth: Payer: Medicare Other | Admitting: Orthopaedic Surgery

## 2022-08-13 NOTE — Telephone Encounter (Signed)
Prior Autho Dept from [facility] [phone] lvm stating that the patient paid OOP for his Hydrocodone 7.5-325 and the patient is requesting a prior autho to get reimbursed.

## 2022-08-17 NOTE — Telephone Encounter (Signed)
PA started. Pending medical review. Prior Auth: [id number]

## 2022-08-25 ENCOUNTER — Ambulatory Visit: Payer: Medicare Other | Admitting: Orthopaedic Surgery

## 2022-08-25 ENCOUNTER — Encounter: Payer: Self-pay | Admitting: Orthopaedic Surgery

## 2022-08-25 VITALS — BP 138/86 | HR 78 | Ht 74.0 in | Wt 387.0 lb

## 2022-08-25 DIAGNOSIS — J4550 Severe persistent asthma, uncomplicated: Secondary | ICD-10-CM | POA: Insufficient documentation

## 2022-08-25 DIAGNOSIS — Z6841 Body Mass Index (BMI) 40.0 and over, adult: Secondary | ICD-10-CM | POA: Diagnosis not present

## 2022-08-25 DIAGNOSIS — M545 Low back pain, unspecified: Secondary | ICD-10-CM | POA: Diagnosis not present

## 2022-08-25 MED ORDER — PREDNISONE 5 MG (21) PO TBPK: ORAL_TABLET | ORAL | 0 refills | Status: DC

## 2022-08-25 NOTE — Progress Notes (Signed)
My back is tender  He has chronic pain of the lumbar spine.  He has good and bad days.  He has no weakness or trauma.  He is active and doing his exercises and taking his medicine.  Spine/Pelvis examination:  Inspection:  Overall, sacoiliac joint benign and hips nontender; without crepitus or defects.   Thoracic spine inspection: Alignment normal without kyphosis present   Lumbar spine inspection:  Alignment  with normal lumbar lordosis, without scoliosis apparent.   Thoracic spine palpation:  without tenderness of spinal processes   Lumbar spine palpation: without tenderness of lumbar area; without tightness of lumbar muscles    Range of Motion:   Lumbar flexion, forward flexion is normal without pain or tenderness    Lumbar extension is full without pain or tenderness   Left lateral bend is normal without pain or tenderness   Right lateral bend is normal without pain or tenderness   Straight leg raising is normal  Strength & tone: normal   Stability overall normal stability  Encounter Diagnoses  Name Primary?   Lumbar pain Yes   Body mass index 45.0-49.9, adult ([place])    Morbid obesity ([place])    He is travelling to [place] in a few weeks.  I will call in prednisone dose pack for him to have as needed.  Call if any problem.  Precautions discussed.  Return in three months.  Electronically Signed Sanjuana Kava, MD 3/19/20249:36 AM

## 2022-09-08 ENCOUNTER — Telehealth: Payer: Self-pay

## 2022-09-08 MED ORDER — HYDROCODONE-ACETAMINOPHEN 7.5-325 MG PO TABS: 1.0000 | ORAL_TABLET | Freq: Four times a day (QID) | ORAL | 0 refills | Status: DC | PRN

## 2022-09-08 NOTE — Telephone Encounter (Signed)
Hydrocodone-Acetaminophen 7.5/325 MG  Qty 100 Tablets   PATIENT USES [place] [facility]

## 2022-09-22 ENCOUNTER — Telehealth: Payer: Self-pay | Admitting: Orthopaedic Surgery

## 2022-09-22 NOTE — Telephone Encounter (Signed)
Called number provided for [facility]. No answer. Left message asking for call back.

## 2022-09-22 NOTE — Telephone Encounter (Signed)
Dr. Sanjuan Dame pt Martin Bruce w/[employer] Clinical Appeals [phone] lvm stating that she is calling regarding a medical appeal regarding Hydrocodone.  She has some clinical questions.

## 2022-10-07 ENCOUNTER — Telehealth: Payer: Self-pay | Admitting: Orthopaedic Surgery

## 2022-10-07 NOTE — Telephone Encounter (Signed)
Dr. Sanjuan Dame pt, Dr. Hilda Lias is out of the office - spoke w/the patient, he is requesting a refill on Hydrocodone 7.5-325, 100 quantity, every 6 hours PRN for moderate pain, must last 30 days tp be sent to [facility].

## 2022-10-13 ENCOUNTER — Telehealth: Payer: Self-pay | Admitting: Orthopaedic Surgery

## 2022-10-13 MED ORDER — HYDROCODONE-ACETAMINOPHEN 7.5-325 MG PO TABS: 1.0000 | ORAL_TABLET | Freq: Four times a day (QID) | ORAL | 0 refills | Status: DC | PRN

## 2022-10-13 NOTE — Telephone Encounter (Signed)
Dr. Sanjuan Dame pt - spoke w/the pt, he is requesting a refill on Hydrocodone 7.5-325 to be sent to [facility].

## 2022-11-05 ENCOUNTER — Telehealth: Payer: Self-pay | Admitting: Orthopaedic Surgery

## 2022-11-05 NOTE — Telephone Encounter (Signed)
DR. Hilda Lias Patient called requested refill on his pain medicine   HYDROcodone-acetaminophen (NORCO) 7.5-325 MG tablet   Pharmacy:  [facility] [address] in [place].

## 2022-11-09 MED ORDER — HYDROCODONE-ACETAMINOPHEN 7.5-325 MG PO TABS: 1.0000 | ORAL_TABLET | Freq: Four times a day (QID) | ORAL | 0 refills | Status: DC | PRN

## 2022-11-10 ENCOUNTER — Ambulatory Visit: Payer: Medicare Other | Admitting: Pulmonary Disease

## 2022-11-10 ENCOUNTER — Encounter: Payer: Self-pay | Admitting: Pulmonary Disease

## 2022-11-10 VITALS — BP 130/84 | HR 68 | Ht 74.0 in | Wt 377.2 lb

## 2022-11-10 DIAGNOSIS — J019 Acute sinusitis, unspecified: Secondary | ICD-10-CM | POA: Diagnosis not present

## 2022-11-10 DIAGNOSIS — J4540 Moderate persistent asthma, uncomplicated: Secondary | ICD-10-CM | POA: Diagnosis not present

## 2022-11-10 MED ORDER — PREDNISONE 10 MG PO TABS: ORAL_TABLET | ORAL | 0 refills | Status: AC

## 2022-11-10 MED ORDER — DOXYCYCLINE HYCLATE 100 MG PO TABS: 100.0000 mg | ORAL_TABLET | Freq: Two times a day (BID) | ORAL | 0 refills | Status: DC

## 2022-11-10 NOTE — Patient Instructions (Addendum)
Continue symbicort inhaler 2 puffs twice daily - rinse mouth out after each use  Use albuterol inhaler or nebulizer treatment every 4-6 hours as needed  Start steroid taper   Start Zpak  Follow up in 3 months with pulmonary function tests

## 2022-11-10 NOTE — Progress Notes (Signed)
Synopsis: Referred in June 2024 for asthma by Inez Pilgrim, NP  Subjective:   PATIENT ID: Martin Bruce GENDER: male DOB: 1976/01/23, MRN: [id number]  HPI  Chief Complaint  Patient presents with   Consult    Referred by PCP for history of asthma. States he has been dealing with a flare up since this past Saturday with a productive cough with yellow phlegm, increased SOB and chest tightness.    Martin Bruce is a 47 year old male, never smoker with history of asthma who is referred to pulmonary clinic for evaluation of asthma.  He reports an increase in cough, wheezing and shortness of breath over the past few days and more so over recent months. He was recently started on symbicort 160-4.84mcg 2 puffs twice daily by his primary care team. He is using albuterol as needed every 1 hour on some days without relief. He has seasonal allergies. He has sinus congestion with drainage and ear fullness. He snores and does not feel rested after sleep.   He was diagnosed with asthma in 1989. He lives alone. Never smoker and no second hand smoke exposure. He is on disability. He has no pets. He previously worked at [employer] in the past. His father had history of carcinoid lung cancer, never smoker.   Past Medical History:  Diagnosis Date   Anxiety    Asthma    Chronic, continuous use of opioids    per Dr Allena Katz note in 2022   Gout    Psychosis (HCC)    per DR Allena Katz note in 2022     Family History  Problem Relation Age of Onset   Hypertension Mother    Cancer Father      Social History   Socioeconomic History   Marital status: Single    Spouse name: Not on file   Number of children: Not on file   Years of education: Not on file   Highest education level: Not on file  Occupational History   Not on file  Tobacco Use   Smoking status: Never    Passive exposure: Yes   Smokeless tobacco: Never  Vaping Use   Vaping Use: Never used  Substance and Sexual Activity   Alcohol  use: Yes    Comment: occ   Drug use: Yes    Types: Marijuana    Comment: medical marijuana   Sexual activity: Not Currently  Other Topics Concern   Not on file  Social History Narrative   Not on file   Social Determinants of Health   Financial Resource Strain: Medium Risk (12/04/2020)   Overall Financial Resource Strain (CARDIA)    Difficulty of Paying Living Expenses: Somewhat hard  Food Insecurity: No Food Insecurity (03/07/2022)   Hunger Vital Sign    Worried About Running Out of Food in the Last Year: Never true    Ran Out of Food in the Last Year: Never true  Recent Concern: Food Insecurity - Food Insecurity Present (12/08/2021)   Hunger Vital Sign    Worried About Running Out of Food in the Last Year: Sometimes true    Ran Out of Food in the Last Year: Sometimes true  Transportation Needs: No Transportation Needs (03/07/2022)   PRAPARE - Administrator, Civil Service (Medical): No    Lack of Transportation (Non-Medical): No  Physical Activity: Inactive (12/08/2021)   Exercise Vital Sign    Days of Exercise per Week: 0 days    Minutes  of Exercise per Session: 0 min  Stress: Stress Concern Present (12/04/2020)   [employer] of Occupational Health - Occupational Stress Questionnaire    Feeling of Stress : To some extent  Social Connections: Socially Isolated (12/08/2021)   Social Connection and Isolation Panel [NHANES]    Frequency of Communication with Friends and Family: Three times a week    Frequency of Social Gatherings with Friends and Family: Three times a week    Attends Religious Services: Never    Active Member of Clubs or Organizations: No    Attends Banker Meetings: Never    Marital Status: Never married  Intimate Partner Violence: Not At Risk (03/07/2022)   Humiliation, Afraid, Rape, and Kick questionnaire    Fear of Current or Ex-Partner: No    Emotionally Abused: No    Physically Abused: No    Sexually Abused: No     Allergies   Allergen Reactions   Penicillins Rash     Outpatient Medications Prior to Visit  Medication Sig Dispense Refill   albuterol (PROVENTIL) (2.5 MG/3ML) 0.083% nebulizer solution Take 2.5 mg by nebulization every 6 (six) hours as needed for wheezing or shortness of breath.     albuterol (VENTOLIN HFA) 108 (90 Base) MCG/ACT inhaler INHALE 2 PUFFS BY MOUTH EVERY 6 HOURS AS NEEDED FOR WHEEZING FOR SHORTNESS OF BREATH 18 g 0   allopurinol (ZYLOPRIM) 300 MG tablet Take 1 tablet (300 mg total) by mouth daily. 30 tablet 5   alprazolam (XANAX) 2 MG tablet Take by mouth.     budesonide-formoterol (SYMBICORT) 160-4.5 MCG/ACT inhaler Inhale 2 puffs into the lungs 2 (two) times daily. 1 each 3   FLUoxetine (PROZAC) 20 MG capsule Take 20 mg by mouth daily.     gabapentin (NEURONTIN) 300 MG capsule Take 1 capsule (300 mg total) by mouth at bedtime. 30 capsule 0   HYDROcodone-acetaminophen (NORCO) 7.5-325 MG tablet Take 1 tablet by mouth every 6 (six) hours as needed for moderate pain (Must last 30 days.). 100 tablet 0   hydrOXYzine (VISTARIL) 50 MG capsule Take 100 mg by mouth every 8 (eight) hours as needed.     ondansetron (ZOFRAN-ODT) 4 MG disintegrating tablet Take 4 mg by mouth every 8 (eight) hours as needed.     ziprasidone (GEODON) 60 MG capsule Take 60 mg by mouth 2 (two) times daily.     predniSONE (STERAPRED UNI-PAK 21 TAB) 5 MG (21) TBPK tablet Take 6 pills first day; 5 pills second day; 4 pills third day; 3 pills fourth day; 2 pills next day and 1 pill last day. 21 tablet 0   furosemide (LASIX) 20 MG tablet Take 1 tablet (20 mg total) by mouth every other day for 4 doses. 4 tablet 0   No facility-administered medications prior to visit.    Review of Systems  Constitutional:  Negative for chills, fever, malaise/fatigue and weight loss.  HENT:  Positive for congestion. Negative for sinus pain and sore throat.   Eyes: Negative.   Respiratory:  Positive for cough, sputum production, shortness of  breath and wheezing. Negative for hemoptysis.   Cardiovascular:  Negative for chest pain, palpitations, orthopnea, claudication and leg swelling.  Gastrointestinal:  Negative for abdominal pain, heartburn, nausea and vomiting.  Genitourinary: Negative.   Musculoskeletal:  Negative for joint pain and myalgias.  Skin:  Negative for rash.  Neurological:  Negative for weakness.  Endo/Heme/Allergies: Negative.   Psychiatric/Behavioral: Negative.        Objective:  Vitals:   11/10/22 1430  BP: 130/84  Pulse: 68  SpO2: 99%  Weight: (!) 377 lb 3.2 oz (171.1 kg)  Height: 6\' 2"  (1.88 m)     Physical Exam Constitutional:      General: He is not in acute distress.    Appearance: He is obese.  HENT:     Head: Normocephalic and atraumatic.  Eyes:     Conjunctiva/sclera: Conjunctivae normal.  Cardiovascular:     Rate and Rhythm: Normal rate and regular rhythm.     Pulses: Normal pulses.     Heart sounds: Normal heart sounds. No murmur heard. Pulmonary:     Breath sounds: No wheezing, rhonchi or rales.  Musculoskeletal:     Right lower leg: No edema.     Left lower leg: No edema.  Skin:    General: Skin is warm and dry.  Neurological:     General: No focal deficit present.     Mental Status: He is alert.       CBC    Component Value Date/Time   WBC 8.4 03/14/2022 0511   RBC 4.61 03/14/2022 0511   HGB 14.0 03/14/2022 0511   HCT 41.9 03/14/2022 0511   PLT 375 03/14/2022 0511   MCV 90.9 03/14/2022 0511   MCH 30.4 03/14/2022 0511   MCHC 33.4 03/14/2022 0511   RDW 12.4 03/14/2022 0511   LYMPHSABS 2.9 03/14/2022 0511   MONOABS 0.6 03/14/2022 0511   EOSABS 0.2 03/14/2022 0511   BASOSABS 0.1 03/14/2022 0511      Latest Ref Rng & Units 03/14/2022    5:11 AM 03/07/2022    1:18 AM 03/06/2022    4:28 PM  BMP  Glucose 70 - 99 mg/dL 409  811  914   BUN 6 - 20 mg/dL 11  12  12    Creatinine 0.61 - 1.24 mg/dL 7.82  9.56  2.13   Sodium 135 - 145 mmol/L 134  136  136    Potassium 3.5 - 5.1 mmol/L 4.3  3.4  2.8   Chloride 98 - 111 mmol/L 97  97  95   CO2 22 - 32 mmol/L 24  28  29    Calcium 8.9 - 10.3 mg/dL 9.0  8.0  8.4    Chest imaging: CTA Chest 03/07/22 Cardiovascular: Pulmonary arteries are well opacified. Study mildly degraded by motion. There is no evidence of a pulmonary embolism.   Heart is normal in size and configuration. No pericardial effusion. Normal great vessels.   Mediastinum/Nodes: No enlarged mediastinal, hilar, or axillary lymph nodes. Thyroid gland, trachea, and esophagus demonstrate no significant findings.   Lungs/Pleura: Minor dependent subsegmental atelectasis. Lungs otherwise clear. No pleural effusion or pneumothorax.  PFT:     No data to display          Labs:  Path:  Echo:  Heart Catheterization:  Assessment & Plan:   Moderate persistent asthma without complication  Acute sinusitis, recurrence not specified, unspecified location - Plan: predniSONE (DELTASONE) 10 MG tablet, doxycycline (VIBRA-TABS) 100 MG tablet  Discussion: Martin Bruce is a 47 year old male, never smoker with history of asthma who is referred to pulmonary clinic for evaluation of asthma.  He appears to have moderate persistent asthma given his history. He is to continue symbicort 160-4.58mcg 2 puffs twice daily and as needed albuterol inhaler 1-2 puffs every 4-6 hours. Advised against using albuterol every 1 hour.   He is to start prolonged steroid taper and doxycycline for concern of  sinusitis aggravating his asthma.   Follow up in 3 months for pulmonary function tests. Will consider home sleep testing at that time.  Melody Comas, MD [facility] Office: [phone]   Current Outpatient Medications:    albuterol (PROVENTIL) (2.5 MG/3ML) 0.083% nebulizer solution, Take 2.5 mg by nebulization every 6 (six) hours as needed for wheezing or shortness of breath., Disp: , Rfl:    albuterol (VENTOLIN HFA) 108  (90 Base) MCG/ACT inhaler, INHALE 2 PUFFS BY MOUTH EVERY 6 HOURS AS NEEDED FOR WHEEZING FOR SHORTNESS OF BREATH, Disp: 18 g, Rfl: 0   allopurinol (ZYLOPRIM) 300 MG tablet, Take 1 tablet (300 mg total) by mouth daily., Disp: 30 tablet, Rfl: 5   alprazolam (XANAX) 2 MG tablet, Take by mouth., Disp: , Rfl:    budesonide-formoterol (SYMBICORT) 160-4.5 MCG/ACT inhaler, Inhale 2 puffs into the lungs 2 (two) times daily., Disp: 1 each, Rfl: 3   doxycycline (VIBRA-TABS) 100 MG tablet, Take 1 tablet (100 mg total) by mouth 2 (two) times daily., Disp: 14 tablet, Rfl: 0   FLUoxetine (PROZAC) 20 MG capsule, Take 20 mg by mouth daily., Disp: , Rfl:    gabapentin (NEURONTIN) 300 MG capsule, Take 1 capsule (300 mg total) by mouth at bedtime., Disp: 30 capsule, Rfl: 0   HYDROcodone-acetaminophen (NORCO) 7.5-325 MG tablet, Take 1 tablet by mouth every 6 (six) hours as needed for moderate pain (Must last 30 days.)., Disp: 100 tablet, Rfl: 0   hydrOXYzine (VISTARIL) 50 MG capsule, Take 100 mg by mouth every 8 (eight) hours as needed., Disp: , Rfl:    ondansetron (ZOFRAN-ODT) 4 MG disintegrating tablet, Take 4 mg by mouth every 8 (eight) hours as needed., Disp: , Rfl:    predniSONE (DELTASONE) 10 MG tablet, Take 4 tablets (40 mg total) by mouth daily with breakfast for 3 days, THEN 3 tablets (30 mg total) daily with breakfast for 3 days, THEN 2 tablets (20 mg total) daily with breakfast for 3 days, THEN 1 tablet (10 mg total) daily with breakfast for 3 days., Disp: 30 tablet, Rfl: 0   ziprasidone (GEODON) 60 MG capsule, Take 60 mg by mouth 2 (two) times daily., Disp: , Rfl:    furosemide (LASIX) 20 MG tablet, Take 1 tablet (20 mg total) by mouth every other day for 4 doses., Disp: 4 tablet, Rfl: 0

## 2022-11-25 ENCOUNTER — Ambulatory Visit: Payer: Medicare Other | Admitting: Orthopaedic Surgery

## 2022-12-01 ENCOUNTER — Encounter: Payer: Self-pay | Admitting: Orthopaedic Surgery

## 2022-12-01 ENCOUNTER — Ambulatory Visit: Payer: Medicare Other | Admitting: Orthopaedic Surgery

## 2022-12-01 VITALS — BP 135/84 | HR 68 | Ht 74.0 in | Wt 378.0 lb

## 2022-12-01 DIAGNOSIS — M545 Low back pain, unspecified: Secondary | ICD-10-CM | POA: Diagnosis not present

## 2022-12-01 DIAGNOSIS — M2550 Pain in unspecified joint: Secondary | ICD-10-CM

## 2022-12-01 DIAGNOSIS — M1A09X0 Idiopathic chronic gout, multiple sites, without tophus (tophi): Secondary | ICD-10-CM | POA: Diagnosis not present

## 2022-12-01 DIAGNOSIS — Z6841 Body Mass Index (BMI) 40.0 and over, adult: Secondary | ICD-10-CM

## 2022-12-01 MED ORDER — PREDNISONE 5 MG (21) PO TBPK: ORAL_TABLET | ORAL | 0 refills | Status: DC

## 2022-12-01 MED ORDER — ALLOPURINOL 300 MG PO TABS: 300.0000 mg | ORAL_TABLET | Freq: Every day | ORAL | 5 refills | Status: DC

## 2022-12-01 NOTE — Progress Notes (Signed)
My gout is flaring up  His gout in the left great toe is flaring up.  He is out of allopurinol. I will refill and give prednisone pack.  He has no new trauma.  Lower back is doing well.  He is traveling to [place] next month.  Spine/Pelvis examination:  Inspection:  Overall, sacoiliac joint benign and hips nontender; without crepitus or defects.   Thoracic spine inspection: Alignment normal without kyphosis present   Lumbar spine inspection:  Alignment  with normal lumbar lordosis, without scoliosis apparent.   Thoracic spine palpation:  without tenderness of spinal processes   Lumbar spine palpation: without tenderness of lumbar area; without tightness of lumbar muscles    Range of Motion:   Lumbar flexion, forward flexion is normal without pain or tenderness    Lumbar extension is full without pain or tenderness   Left lateral bend is normal without pain or tenderness   Right lateral bend is normal without pain or tenderness   Straight leg raising is normal  Strength & tone: normal   Stability overall normal stability  His left great toe is tender but no redness or swelling.  Encounter Diagnoses  Name Primary?   Lumbar pain Yes   Idiopathic chronic gout of multiple sites without tophus    Pain in joint, multiple sites    Body mass index 45.0-49.9, adult (HCC)    Morbid obesity (HCC)    Return in three months.  Call if any problem.  Precautions discussed.  Electronically Signed Darreld Mclean, MD 6/25/20249:50 AM

## 2022-12-07 ENCOUNTER — Telehealth: Payer: Self-pay | Admitting: Orthopaedic Surgery

## 2022-12-07 MED ORDER — HYDROCODONE-ACETAMINOPHEN 7.5-325 MG PO TABS: 1.0000 | ORAL_TABLET | Freq: Four times a day (QID) | ORAL | 0 refills | Status: DC | PRN

## 2022-12-07 NOTE — Telephone Encounter (Signed)
Dr. Sanjuan Dame pt - spoke w/the pt, he is requesting a refill on his Hydrocodone 7.5 to be sent to [facility] in [place]

## 2022-12-07 NOTE — Telephone Encounter (Signed)
Pt called back and is requesting it to go ahead and be filled even though it;'s early bc he is going out of town Wednesday and will not be back until the following Wednesday.
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
## 2023-01-05 ENCOUNTER — Telehealth: Payer: Self-pay

## 2023-01-05 MED ORDER — HYDROCODONE-ACETAMINOPHEN 7.5-325 MG PO TABS: 1.0000 | ORAL_TABLET | Freq: Four times a day (QID) | ORAL | 0 refills | Status: DC | PRN

## 2023-01-05 NOTE — Telephone Encounter (Signed)
Hydrocodone-Acetaminophen 7.5/325 MG Qty 100 Tablets  PATIENT USES [place] [facility]  Patient is also asking for another script of Prednisone.

## 2023-01-28 ENCOUNTER — Telehealth: Payer: Self-pay | Admitting: Orthopaedic Surgery

## 2023-01-28 MED ORDER — PREDNISONE 5 MG (21) PO TBPK: ORAL_TABLET | ORAL | 0 refills | Status: DC

## 2023-01-28 NOTE — Telephone Encounter (Signed)
Dr. Sanjuan Dame pt - he is having a really bad flare up of gout in his rt foot.  He has scheduled an appointment with Dr. Hilda Lias for 8/27, but is requesting a prednisone pack to help.

## 2023-01-28 NOTE — Addendum Note (Signed)
Addended by: Thane Edu A on: 01/28/2023 11:31 AM   Modules accepted: Orders

## 2023-02-02 ENCOUNTER — Other Ambulatory Visit (INDEPENDENT_AMBULATORY_CARE_PROVIDER_SITE_OTHER): Payer: Medicare Other

## 2023-02-02 ENCOUNTER — Encounter: Payer: Self-pay | Admitting: Orthopaedic Surgery

## 2023-02-02 ENCOUNTER — Telehealth: Payer: Self-pay | Admitting: Orthopaedic Surgery

## 2023-02-02 ENCOUNTER — Ambulatory Visit: Payer: Medicare Other | Admitting: Orthopaedic Surgery

## 2023-02-02 VITALS — BP 124/88 | HR 76 | Ht 74.0 in | Wt 378.0 lb

## 2023-02-02 DIAGNOSIS — G8929 Other chronic pain: Secondary | ICD-10-CM

## 2023-02-02 DIAGNOSIS — M1A0710 Idiopathic chronic gout, right ankle and foot, without tophus (tophi): Secondary | ICD-10-CM | POA: Diagnosis not present

## 2023-02-02 DIAGNOSIS — M79671 Pain in right foot: Secondary | ICD-10-CM | POA: Diagnosis not present

## 2023-02-02 MED ORDER — FEBUXOSTAT 40 MG PO TABS: ORAL_TABLET | ORAL | 5 refills | Status: DC

## 2023-02-02 MED ORDER — OXYCODONE-ACETAMINOPHEN 5-325 MG PO TABS: ORAL_TABLET | ORAL | 0 refills | Status: DC

## 2023-02-02 MED ORDER — COLCHICINE 0.6 MG PO TABS: ORAL_TABLET | ORAL | 3 refills | Status: DC

## 2023-02-02 NOTE — Telephone Encounter (Signed)
I called them to advise. Ok to fill the Oxycodone today and he will d/c the Hydrocodone

## 2023-02-02 NOTE — Telephone Encounter (Signed)
Dr. Sanjuan Dame pt - [facility] [phone] lvm stating that they have a script for Percocet 5-325, but they currently have filled Oxycodone 7.5, they need to know if this is decreased for the patient.

## 2023-02-02 NOTE — Progress Notes (Signed)
My gout is worse.  He has had gout pain of the right great toe.  He has no trauma.  He has redness and swelling.  Nothing helps.  He is on allopurinol.  The right great toe MTP joint is swollen and very tender. It is not red but has increased warmth.  ROM is painful.  NV intact.  X-rays were done of the right foot, reported separately.  Encounter Diagnoses  Name Primary?   Chronic pain in right foot Yes   Idiopathic chronic gout of right foot without tophus    I will change to Uloric as the allopurinol is not working.  I will begin colchicine also.  I will give pain medicine.  I have reviewed the [place] Controlled Substance Reporting System web site prior to prescribing narcotic medicine for this patient.  Return in one month.  Call if any problem.  Precautions discussed.  Electronically Signed Darreld Mclean, MD 8/27/202411:09 AM

## 2023-02-11 ENCOUNTER — Emergency Department (HOSPITAL_COMMUNITY): Payer: Medicare Other

## 2023-02-11 ENCOUNTER — Emergency Department (HOSPITAL_COMMUNITY)
Admission: EM | Admit: 2023-02-11 | Discharge: 2023-02-11 | Discharge status: Against Medical Advice | Payer: Medicare Other | Attending: Emergency Medicine | Admitting: Emergency Medicine

## 2023-02-11 ENCOUNTER — Other Ambulatory Visit: Payer: Self-pay

## 2023-02-11 ENCOUNTER — Encounter (HOSPITAL_COMMUNITY): Payer: Self-pay | Admitting: Emergency Medicine

## 2023-02-11 DIAGNOSIS — Z5321 Procedure and treatment not carried out due to patient leaving prior to being seen by health care provider: Secondary | ICD-10-CM | POA: Diagnosis not present

## 2023-02-11 DIAGNOSIS — R0789 Other chest pain: Secondary | ICD-10-CM | POA: Diagnosis present

## 2023-02-11 DIAGNOSIS — R42 Dizziness and giddiness: Secondary | ICD-10-CM | POA: Diagnosis not present

## 2023-02-11 LAB — BASIC METABOLIC PANEL
Anion gap: 10 (ref 5–15)
BUN: 12 mg/dL (ref 6–20)
CO2: 25 mmol/L (ref 22–32)
Calcium: 8.7 mg/dL — ABNORMAL LOW (ref 8.9–10.3)
Chloride: 102 mmol/L (ref 98–111)
Creatinine, Ser: 1.15 mg/dL (ref 0.61–1.24)
GFR, Estimated: >60 mL/min (ref >60)
Glucose, Bld: 131 mg/dL — ABNORMAL HIGH (ref 70–99)
Potassium: 3.6 mmol/L (ref 3.5–5.1)
Sodium: 137 mmol/L (ref 135–145)

## 2023-02-11 LAB — CBC
HCT: 48.2 % (ref 39.0–52.0)
Hemoglobin: 16.2 g/dL (ref 13.0–17.0)
MCH: 30.3 pg (ref 26.0–34.0)
MCHC: 33.6 g/dL (ref 30.0–36.0)
MCV: 90.1 fL (ref 80.0–100.0)
Platelets: 285 10*3/uL (ref 150–400)
RBC: 5.35 MIL/uL (ref 4.22–5.81)
RDW: 12.5 % (ref 11.5–15.5)
WBC: 7.3 10*3/uL (ref 4.0–10.5)
nRBC: 0 % (ref 0.0–0.2)

## 2023-02-11 LAB — TROPONIN I (HIGH SENSITIVITY)
Troponin I (High Sensitivity): 6 ng/L (ref <18)
Troponin I (High Sensitivity): 7 ng/L (ref <18)

## 2023-02-11 NOTE — ED Triage Notes (Signed)
Pt to ed pov. C/o "chest fluttering" that started a couple of days ago that comes and goes. Pt states gets dizzy during said chest fluttering events. Pt ambulatory and in no distress at this time. EKG done in triage- pt in NSR

## 2023-02-11 NOTE — ED Notes (Signed)
No answer for room assignment

## 2023-02-22 ENCOUNTER — Other Ambulatory Visit: Payer: Self-pay | Admitting: Radiology

## 2023-02-22 NOTE — Telephone Encounter (Signed)
Patient called, requested refill oxycodone and prednisone pack.  [place] [facility].

## 2023-03-01 ENCOUNTER — Other Ambulatory Visit: Payer: Self-pay | Admitting: Orthopedic Surgery

## 2023-03-01 ENCOUNTER — Telehealth: Payer: Self-pay | Admitting: Orthopaedic Surgery

## 2023-03-01 NOTE — Telephone Encounter (Signed)
Dr. Sanjuan Dame pt - pt is requesting a refill on Oxycodone 5-325 and Prednisone 10mg  to be sent to [facility]

## 2023-03-02 ENCOUNTER — Ambulatory Visit: Payer: Medicare Other | Admitting: Pulmonary Disease

## 2023-03-02 ENCOUNTER — Encounter: Payer: Self-pay | Admitting: Pulmonary Disease

## 2023-03-02 MED ORDER — OXYCODONE-ACETAMINOPHEN 5-325 MG PO TABS: ORAL_TABLET | ORAL | 0 refills | Status: DC

## 2023-03-03 ENCOUNTER — Ambulatory Visit: Payer: Medicare Other | Admitting: Orthopaedic Surgery

## 2023-03-09 ENCOUNTER — Encounter: Payer: Self-pay | Admitting: Orthopaedic Surgery

## 2023-03-09 ENCOUNTER — Ambulatory Visit: Payer: Medicare Other | Admitting: Orthopaedic Surgery

## 2023-03-09 VITALS — BP 153/95 | HR 85 | Ht 73.0 in | Wt 372.0 lb

## 2023-03-09 DIAGNOSIS — M2550 Pain in unspecified joint: Secondary | ICD-10-CM | POA: Diagnosis not present

## 2023-03-09 DIAGNOSIS — M1A09X0 Idiopathic chronic gout, multiple sites, without tophus (tophi): Secondary | ICD-10-CM

## 2023-03-09 DIAGNOSIS — Z6841 Body Mass Index (BMI) 40.0 and over, adult: Secondary | ICD-10-CM | POA: Diagnosis not present

## 2023-03-09 MED ORDER — FEBUXOSTAT 40 MG PO TABS: ORAL_TABLET | ORAL | 5 refills | Status: DC

## 2023-03-09 MED ORDER — COLCHICINE 0.6 MG PO TABS: ORAL_TABLET | ORAL | 3 refills | Status: AC

## 2023-03-09 NOTE — Progress Notes (Signed)
My gout is worse.  He has had pain in the left foot recently.  Prior it was the right foot.  The change to Uloric 40 has helped but I feel he will need to increase the dose to two daily.  I will call this in.  He is out of the colchicine which helps.  He has no new trauma.  The left foot and ankle is tender, he has no redness or swelling but it is very tender.  He has a limp to the left.  NV intact.  Encounter Diagnoses  Name Primary?   Idiopathic chronic gout of multiple sites without tophus Yes   Pain in joint, multiple sites    Body mass index 45.0-49.9, adult (HCC)    Morbid obesity (HCC)    Return in three weeks.  Call if any problem.  Precautions discussed.  Electronically Signed Darreld Mclean, MD 10/1/20242:24 PM'

## 2023-03-30 ENCOUNTER — Encounter: Payer: Self-pay | Admitting: Orthopaedic Surgery

## 2023-03-30 ENCOUNTER — Ambulatory Visit: Payer: Medicare Other | Admitting: Orthopaedic Surgery

## 2023-03-30 VITALS — BP 116/68 | HR 80 | Ht 73.0 in | Wt 372.0 lb

## 2023-03-30 DIAGNOSIS — Z6841 Body Mass Index (BMI) 40.0 and over, adult: Secondary | ICD-10-CM | POA: Diagnosis not present

## 2023-03-30 DIAGNOSIS — M1A09X0 Idiopathic chronic gout, multiple sites, without tophus (tophi): Secondary | ICD-10-CM

## 2023-03-30 DIAGNOSIS — M1A0710 Idiopathic chronic gout, right ankle and foot, without tophus (tophi): Secondary | ICD-10-CM

## 2023-03-30 DIAGNOSIS — M2550 Pain in unspecified joint: Secondary | ICD-10-CM

## 2023-03-30 MED ORDER — PREDNISONE 5 MG (21) PO TBPK: ORAL_TABLET | ORAL | 0 refills | Status: DC

## 2023-03-30 MED ORDER — OXYCODONE-ACETAMINOPHEN 5-325 MG PO TABS: ORAL_TABLET | ORAL | 0 refills | Status: DC

## 2023-03-30 NOTE — Progress Notes (Signed)
I am feeling better.  Changing him to Uloric has helped a lot.  He has no gout pain today and is feeling much better.  He is planning on going to [place] soon.  Both feet have little pain and no swelling  NV intact. Gait is normal.  Encounter Diagnoses  Name Primary?   Idiopathic chronic gout of multiple sites without tophus Yes   Pain in joint, multiple sites    Body mass index 45.0-49.9, adult (HCC)    Morbid obesity (HCC)    Idiopathic chronic gout of right foot without tophus    I have reviewed the [place] Controlled Substance Reporting System web site prior to prescribing narcotic medicine for this patient.  I have refilled his prednisone to have just in case during his vacation.  Return in six weeks.  Call if any problem.  Precautions discussed.  Electronically Signed Darreld Mclean, MD 10/22/20242:26 PM

## 2023-04-26 ENCOUNTER — Telehealth: Payer: Self-pay | Admitting: Orthopaedic Surgery

## 2023-04-26 MED ORDER — OXYCODONE-ACETAMINOPHEN 5-325 MG PO TABS: ORAL_TABLET | ORAL | 0 refills | Status: DC

## 2023-04-26 NOTE — Telephone Encounter (Signed)
Dr. Sanjuan Dame - pt lvm requesting a refill on Oxycodone 5-325 and Prednisone 5mg  to be sent to [facility] [place].

## 2023-04-30 ENCOUNTER — Ambulatory Visit: Payer: Medicare Other | Attending: Cardiology | Admitting: Cardiology

## 2023-05-03 ENCOUNTER — Encounter: Payer: Self-pay | Admitting: Cardiology

## 2023-05-11 ENCOUNTER — Ambulatory Visit: Payer: Medicare Other | Admitting: Orthopaedic Surgery

## 2023-05-12 ENCOUNTER — Ambulatory Visit: Payer: Medicare Other | Admitting: Orthopaedic Surgery

## 2023-05-24 ENCOUNTER — Telehealth: Payer: Self-pay | Admitting: Orthopaedic Surgery

## 2023-05-24 MED ORDER — OXYCODONE-ACETAMINOPHEN 5-325 MG PO TABS: ORAL_TABLET | ORAL | 0 refills | Status: DC

## 2023-05-24 NOTE — Telephone Encounter (Signed)
Dr. Sanjuan Dame pt - pt lvm for a refill on his Oxycodone 5-325 to be sent to [facility]

## 2023-05-26 ENCOUNTER — Ambulatory Visit: Payer: Medicare Other | Admitting: Orthopaedic Surgery

## 2023-05-27 ENCOUNTER — Ambulatory Visit: Payer: Medicare Other | Admitting: Orthopaedic Surgery

## 2023-05-27 ENCOUNTER — Encounter: Payer: Self-pay | Admitting: Orthopaedic Surgery

## 2023-05-27 VITALS — BP 121/69 | HR 76 | Ht 73.0 in | Wt 377.0 lb

## 2023-05-27 DIAGNOSIS — M1A09X0 Idiopathic chronic gout, multiple sites, without tophus (tophi): Secondary | ICD-10-CM | POA: Diagnosis not present

## 2023-05-27 DIAGNOSIS — Z6841 Body Mass Index (BMI) 40.0 and over, adult: Secondary | ICD-10-CM

## 2023-05-27 NOTE — Progress Notes (Signed)
My gout pain is good.  He has not had an acute attack of gout in a while now.  He has some tenderness of the right foot at times.  He has no new trauma.  He is taking his medicine.  Right foot is negative.  NV intact.  ROM ankle is full.  Gait normal.  Encounter Diagnoses  Name Primary?   Idiopathic chronic gout of multiple sites without tophus Yes   Body mass index 45.0-49.9, adult (HCC)    Morbid obesity (HCC)    Return in two months.  He is going to [place] in February.  I will see him before he goes.  Call if any problem.  Precautions discussed.  Electronically Signed Darreld Mclean, MD 12/19/20248:50 AM

## 2023-06-23 ENCOUNTER — Telehealth: Payer: Self-pay | Admitting: Orthopaedic Surgery

## 2023-06-23 MED ORDER — OXYCODONE-ACETAMINOPHEN 5-325 MG PO TABS: ORAL_TABLET | ORAL | 0 refills | Status: DC

## 2023-06-23 MED ORDER — PREDNISONE 5 MG (21) PO TBPK: ORAL_TABLET | ORAL | 0 refills | Status: DC

## 2023-06-23 NOTE — Telephone Encounter (Signed)
 Dr. Vicente Graham pt - pt lvm requesting a refill on Prednisone  5mg  and Oxycodone  5-325 to be sent to [facility]

## 2023-07-01 NOTE — Progress Notes (Deleted)
 CARDIOLOGY CONSULT NOTE       Patient ID: KALIQUE DUFEK MRN: [id number] DOB/AGE: [birth year] 48 y.o.  Referring Physician: Duane Lope Primary Physician: [facility] Cardiologist: New Reason for Consultation: Palpitations    HPI:  48 y.o. referred by Dr Tenny Craw for palpitations. History of GAD, Asthma, chronic pain on opioids, GERD, Psychosis and Schizoaffective disorder. He takes Uloric for gout and sees Dr Cherylann Banas for this   Seen at [facility] ED for palpitations and anxiety with pain and tightness in chest. Not in afib and d/c  Troponins negative He is obese with weight 374 lbs   He did have documented atrial flutter 03/06/22 when seen at [facility] for right LE celulitis Had severe hypokalemia converted spontaneously CHADVASC 0 so no anticoagulation. I did review his ECG from 9/29 and it showed typical flutter at rate of 174 bpm. No pre excitation ECG done 02/11/23 was normal sinus rate 95   ***  ROS All other systems reviewed and negative except as noted above  Past Medical History:  Diagnosis Date  . Anxiety   . Asthma   . BMI 45.0-49.9, adult (HCC)   . Cellulitis   . Chronic pain   . Chronic, continuous use of opioids    per Dr Allena Katz note in 2022  . GERD (gastroesophageal reflux disease)   . Gout   . Major depressive disorder   . Palpitations   . Panic disorder   . Panic disorder   . Psychosis (HCC)    per DR Allena Katz note in 2022  . Schizoaffective disorder (HCC)     Family History  Problem Relation Age of Onset  . Hypertension Mother   . Cancer Father     Social History   Socioeconomic History  . Marital status: Single    Spouse name: Not on file  . Number of children: Not on file  . Years of education: Not on file  . Highest education level: Not on file  Occupational History  . Not on file  Tobacco Use  . Smoking status: Never    Passive exposure: Yes  . Smokeless tobacco: Never  Vaping Use  . Vaping status: Never Used  Substance  and Sexual Activity  . Alcohol use: Not Currently    Comment: occ  . Drug use: Yes    Types: Marijuana    Comment: medical marijuana  . Sexual activity: Not Currently  Other Topics Concern  . Not on file  Social History Narrative  . Not on file   Social Drivers of Health   Financial Resource Strain: Medium Risk (12/04/2020)   Overall Financial Resource Strain (CARDIA)   . Difficulty of Paying Living Expenses: Somewhat hard  Food Insecurity: No Food Insecurity (04/17/2022)   Received from [facility]   Hunger Vital Sign   . Worried About Programme researcher, broadcasting/film/video in the Last Year: Never true   . Ran Out of Food in the Last Year: Never true  Transportation Needs: No Transportation Needs (03/07/2022)   PRAPARE - Transportation   . Lack of Transportation (Medical): No   . Lack of Transportation (Non-Medical): No  Physical Activity: Inactive (12/08/2021)   Exercise Vital Sign   . Days of Exercise per Week: 0 days   . Minutes of Exercise per Session: 0 min  Stress: Stress Concern Present (12/04/2020)   [employer] of Occupational Health - Occupational Stress Questionnaire   . Feeling of Stress : To some extent  Social  Connections: Unknown (02/24/2022)   Received from [facility], [facility]   Social Network   . Social Network: Not on file  Recent Concern: Social Connections - Socially Isolated (12/08/2021)   Social Connection and Isolation Panel [NHANES]   . Frequency of Communication with Friends and Family: Three times a week   . Frequency of Social Gatherings with Friends and Family: Three times a week   . Attends Religious Services: Never   . Active Member of Clubs or Organizations: No   . Attends Banker Meetings: Never   . Marital Status: Never married  Intimate Partner Violence: Not At Risk (03/07/2022)   Humiliation, Afraid, Rape, and Kick questionnaire   . Fear of Current or Ex-Partner: No   . Emotionally Abused: No   . Physically Abused:  No   . Sexually Abused: No    Past Surgical History:  Procedure Laterality Date  . CARPAL TUNNEL RELEASE        Current Outpatient Medications:  .  albuterol (PROVENTIL) (2.5 MG/3ML) 0.083% nebulizer solution, Take 2.5 mg by nebulization every 6 (six) hours as needed for wheezing or shortness of breath., Disp: , Rfl:  .  albuterol (VENTOLIN HFA) 108 (90 Base) MCG/ACT inhaler, INHALE 2 PUFFS BY MOUTH EVERY 6 HOURS AS NEEDED FOR WHEEZING FOR SHORTNESS OF BREATH, Disp: 18 g, Rfl: 0 .  alprazolam (XANAX) 2 MG tablet, Take by mouth., Disp: , Rfl:  .  budesonide-formoterol (SYMBICORT) 160-4.5 MCG/ACT inhaler, Inhale 2 puffs into the lungs 2 (two) times daily., Disp: 1 each, Rfl: 3 .  colchicine 0.6 MG tablet, One by mouth three times a day for five days for gout pain., Disp: 15 tablet, Rfl: 3 .  febuxostat (ULORIC) 40 MG tablet, One twice daily for gout, Disp: 60 tablet, Rfl: 5 .  FLUoxetine (PROZAC) 20 MG capsule, Take 20 mg by mouth daily., Disp: , Rfl:  .  furosemide (LASIX) 20 MG tablet, Take 1 tablet (20 mg total) by mouth every other day for 4 doses., Disp: 4 tablet, Rfl: 0 .  gabapentin (NEURONTIN) 300 MG capsule, Take 1 capsule (300 mg total) by mouth at bedtime., Disp: 30 capsule, Rfl: 0 .  hydrOXYzine (ATARAX) 50 MG tablet, Take 50 mg by mouth daily as needed., Disp: , Rfl:  .  hydrOXYzine (VISTARIL) 50 MG capsule, Take 100 mg by mouth every 8 (eight) hours as needed., Disp: , Rfl:  .  ondansetron (ZOFRAN-ODT) 4 MG disintegrating tablet, Take 4 mg by mouth every 8 (eight) hours as needed., Disp: , Rfl:  .  oxyCODONE-acetaminophen (PERCOCET/ROXICET) 5-325 MG tablet, One tablet every six hours for pain., Disp: 110 tablet, Rfl: 0 .  predniSONE (STERAPRED UNI-PAK 21 TAB) 5 MG (21) TBPK tablet, Take 6 pills first day; 5 pills second day; 4 pills third day; 3 pills fourth day; 2 pills next day and 1 pill last day., Disp: 21 tablet, Rfl: 0 .  ziprasidone (GEODON) 60 MG capsule, Take 60 mg by  mouth 2 (two) times daily., Disp: , Rfl:     Physical Exam: There were no vitals taken for this visit.    Affect appropriate Obese black male  HEENT: normal Neck supple with no adenopathy JVP normal no bruits no thyromegaly Lungs clear with no wheezing and good diaphragmatic motion Heart:  S1/S2 no murmur, no rub, gallop or click PMI normal Abdomen: benighn, BS positve, no tenderness, no AAA no bruit.  No HSM or HJR RLE venous stasis  Neuro non-focal Skin  warm and dry No muscular weakness   Labs:   Lab Results  Component Value Date   WBC 7.3 02/11/2023   HGB 16.2 02/11/2023   HCT 48.2 02/11/2023   MCV 90.1 02/11/2023   PLT 285 02/11/2023   No results for input(s): "NA", "K", "CL", "CO2", "BUN", "CREATININE", "CALCIUM", "PROT", "BILITOT", "ALKPHOS", "ALT", "AST", "GLUCOSE" in the last 168 hours.  Invalid input(s): "LABALBU" Lab Results  Component Value Date   TROPONINI <0.03 06/06/2016   No results found for: "CHOL" No results found for: "HDL" No results found for: "LDLCALC" No results found for: "TRIG" No results found for: "CHOLHDL" No results found for: "LDLDIRECT"    Radiology: No results found.  EKG: See HPI   ASSESSMENT AND PLAN:   Palpitations: 02/2022 had typical flutter in setting of cellulitis and low K. Recent ER visit 02/2023 with palpitations and no flutter just NSR. Check echo for structural dx. 30 day monitor. Start low dose Toprol 50 mg daily GAD/ with Schizoaffective disorder  has multiple meds including Giodon, Atarax, neurontin, prozac and xanax Suspect panic attacks contribute to symptoms Gout:  f/u Hilda Lias now on Uloric right foot affected most Asthma: mild has ventolin and symbicort ***  30 day monitor TTE Toprol 50 mg daily   F/U in 3 months   Signed: Charlton Haws 07/01/2023, 12:31 PM

## 2023-07-09 ENCOUNTER — Ambulatory Visit: Payer: Medicare Other | Attending: Cardiovascular Disease | Admitting: Cardiovascular Disease

## 2023-07-20 ENCOUNTER — Telehealth: Payer: Self-pay | Admitting: Orthopaedic Surgery

## 2023-07-20 MED ORDER — OXYCODONE-ACETAMINOPHEN 5-325 MG PO TABS: ORAL_TABLET | ORAL | 0 refills | Status: DC

## 2023-07-20 NOTE — Telephone Encounter (Signed)
Dr. Sanjuan Dame pt - spoke w/the pt, he is requesting a refill on Oxycodone 5-325 to be sent to [facility]

## 2023-07-27 ENCOUNTER — Emergency Department (HOSPITAL_COMMUNITY)
Admission: EM | Admit: 2023-07-27 | Discharge: 2023-07-27 | Discharge status: Against Medical Advice | Payer: Medicare Other

## 2023-07-27 NOTE — ED Notes (Signed)
 Called for Pt from waiting room X 3. No answer

## 2023-07-28 ENCOUNTER — Encounter (HOSPITAL_COMMUNITY): Payer: Self-pay | Admitting: *Deleted

## 2023-07-28 ENCOUNTER — Emergency Department (HOSPITAL_COMMUNITY): Payer: Medicare Other

## 2023-07-28 ENCOUNTER — Other Ambulatory Visit: Payer: Self-pay

## 2023-07-28 ENCOUNTER — Emergency Department (HOSPITAL_COMMUNITY)
Admission: EM | Admit: 2023-07-28 | Discharge: 2023-07-28 | Disposition: A | Discharge status: Home / Self Care | Payer: Medicare Other | Attending: Emergency Medicine | Admitting: Emergency Medicine

## 2023-07-28 ENCOUNTER — Encounter: Payer: Self-pay | Admitting: Orthopaedic Surgery

## 2023-07-28 ENCOUNTER — Ambulatory Visit: Payer: Medicare Other | Admitting: Orthopaedic Surgery

## 2023-07-28 VITALS — BP 166/105 | HR 76 | Ht 74.0 in | Wt 371.2 lb

## 2023-07-28 DIAGNOSIS — I10 Essential (primary) hypertension: Secondary | ICD-10-CM | POA: Diagnosis not present

## 2023-07-28 DIAGNOSIS — Z79899 Other long term (current) drug therapy: Secondary | ICD-10-CM | POA: Insufficient documentation

## 2023-07-28 DIAGNOSIS — J101 Influenza due to other identified influenza virus with other respiratory manifestations: Secondary | ICD-10-CM | POA: Insufficient documentation

## 2023-07-28 DIAGNOSIS — M1A09X0 Idiopathic chronic gout, multiple sites, without tophus (tophi): Secondary | ICD-10-CM

## 2023-07-28 DIAGNOSIS — R1084 Generalized abdominal pain: Secondary | ICD-10-CM | POA: Insufficient documentation

## 2023-07-28 DIAGNOSIS — R1110 Vomiting, unspecified: Secondary | ICD-10-CM | POA: Diagnosis present

## 2023-07-28 DIAGNOSIS — M2550 Pain in unspecified joint: Secondary | ICD-10-CM | POA: Diagnosis not present

## 2023-07-28 DIAGNOSIS — J111 Influenza due to unidentified influenza virus with other respiratory manifestations: Secondary | ICD-10-CM

## 2023-07-28 DIAGNOSIS — Z6841 Body Mass Index (BMI) 40.0 and over, adult: Secondary | ICD-10-CM

## 2023-07-28 LAB — RESP PANEL BY RT-PCR (RSV, FLU A&B, COVID)  RVPGX2
Influenza A by PCR: POSITIVE — AB
Influenza B by PCR: NEGATIVE
Resp Syncytial Virus by PCR: NEGATIVE
SARS Coronavirus 2 by RT PCR: NEGATIVE

## 2023-07-28 LAB — COMPREHENSIVE METABOLIC PANEL
ALT: 45 U/L — ABNORMAL HIGH (ref 0–44)
AST: 41 U/L (ref 15–41)
Albumin: 3.9 g/dL (ref 3.5–5.0)
Alkaline Phosphatase: 96 U/L (ref 38–126)
Anion gap: 14 (ref 5–15)
BUN: 13 mg/dL (ref 6–20)
CO2: 26 mmol/L (ref 22–32)
Calcium: 8.7 mg/dL — ABNORMAL LOW (ref 8.9–10.3)
Chloride: 93 mmol/L — ABNORMAL LOW (ref 98–111)
Creatinine, Ser: 1.1 mg/dL (ref 0.61–1.24)
GFR, Estimated: >60 mL/min (ref >60)
Glucose, Bld: 142 mg/dL — ABNORMAL HIGH (ref 70–99)
Potassium: 3.6 mmol/L (ref 3.5–5.1)
Sodium: 133 mmol/L — ABNORMAL LOW (ref 135–145)
Total Bilirubin: 1.1 mg/dL (ref 0.0–1.2)
Total Protein: 8.2 g/dL — ABNORMAL HIGH (ref 6.5–8.1)

## 2023-07-28 LAB — URINALYSIS, ROUTINE W REFLEX MICROSCOPIC
Bilirubin Urine: NEGATIVE
Glucose, UA: NEGATIVE mg/dL
Hgb urine dipstick: NEGATIVE
Ketones, ur: NEGATIVE mg/dL
Leukocytes,Ua: NEGATIVE
Nitrite: NEGATIVE
Protein, ur: NEGATIVE mg/dL
Specific Gravity, Urine: 1.036 — ABNORMAL HIGH (ref 1.005–1.030)
pH: 6 (ref 5.0–8.0)

## 2023-07-28 LAB — CBC
HCT: 53.4 % — ABNORMAL HIGH (ref 39.0–52.0)
Hemoglobin: 18.1 g/dL — ABNORMAL HIGH (ref 13.0–17.0)
MCH: 30.5 pg (ref 26.0–34.0)
MCHC: 33.9 g/dL (ref 30.0–36.0)
MCV: 89.9 fL (ref 80.0–100.0)
Platelets: 249 10*3/uL (ref 150–400)
RBC: 5.94 MIL/uL — ABNORMAL HIGH (ref 4.22–5.81)
RDW: 12.3 % (ref 11.5–15.5)
WBC: 7.5 10*3/uL (ref 4.0–10.5)
nRBC: 0 % (ref 0.0–0.2)

## 2023-07-28 LAB — LIPASE, BLOOD: Lipase: 62 U/L — ABNORMAL HIGH (ref 11–51)

## 2023-07-28 LAB — TROPONIN I (HIGH SENSITIVITY)
Troponin I (High Sensitivity): 9 ng/L (ref <18)
Troponin I (High Sensitivity): 5 ng/L (ref <18)

## 2023-07-28 MED ORDER — BENZONATATE 200 MG PO CAPS: 200.0000 mg | ORAL_CAPSULE | Freq: Three times a day (TID) | ORAL | 0 refills | Status: AC | PRN

## 2023-07-28 MED ORDER — ONDANSETRON 8 MG PO TBDP
8.0000 mg | ORAL_TABLET | Freq: Once | ORAL | Status: AC
  Administered 2023-07-28: 8 mg via ORAL
  Filled 2023-07-28: qty 1

## 2023-07-28 MED ORDER — IOHEXOL 300 MG/ML  SOLN
100.0000 mL | Freq: Once | INTRAMUSCULAR | Status: AC | PRN
  Administered 2023-07-28: 100 mL via INTRAVENOUS

## 2023-07-28 MED ORDER — IPRATROPIUM-ALBUTEROL 0.5-2.5 (3) MG/3ML IN SOLN
3.0000 mL | Freq: Once | RESPIRATORY_TRACT | Status: AC
  Administered 2023-07-28: 3 mL via RESPIRATORY_TRACT
  Filled 2023-07-28: qty 3

## 2023-07-28 MED ORDER — AMLODIPINE BESYLATE 5 MG PO TABS: 5.0000 mg | ORAL_TABLET | Freq: Every day | ORAL | 0 refills | Status: AC

## 2023-07-28 MED ORDER — ALBUTEROL SULFATE HFA 108 (90 BASE) MCG/ACT IN AERS
2.0000 | INHALATION_SPRAY | Freq: Once | RESPIRATORY_TRACT | Status: AC
  Administered 2023-07-28: 2 via RESPIRATORY_TRACT
  Filled 2023-07-28: qty 6.7

## 2023-07-28 MED ORDER — BENZONATATE 100 MG PO CAPS
200.0000 mg | ORAL_CAPSULE | Freq: Once | ORAL | Status: AC
  Administered 2023-07-28: 200 mg via ORAL
  Filled 2023-07-28: qty 2

## 2023-07-28 NOTE — Discharge Instructions (Addendum)
 Your workup today shows that you have the flu.  The CT of your abdomen today was reassuring.  You have been prescribed medication to help with your cough as well as an albuterol inhaler.  Use as directed.  Rest, plenty of fluids.  You will need to follow-up closely with your primary care provider for recheck in a few days.  Also, your blood pressure today is elevated.  You have been started on a medication for your blood pressure.  Your primary care provider will need to reevaluate you for this within 1 week.  Return to the ER for any new or worsening symptoms.

## 2023-07-28 NOTE — ED Provider Notes (Signed)
 [place] EMERGENCY DEPARTMENT AT [facility] Provider Note   CSN: [id number] Arrival date & time: 07/28/23  0930     History  Chief Complaint  Patient presents with   Generalized Body Aches    Martin Bruce is a 48 y.o. male.  HPI      Martin Bruce is a 48 y.o. male past medical history of chronic pain, GERD, schizoaffective disorder, palpitations, anxiety who presents to the Emergency Department complaining of generalized bodyaches, vomiting, abdominal pain, chills.  Symptoms present for 3 days.  Describes diffuse abdominal pain.  Unable to keep down liquids or solid food.  No known fever.  Also endorses cough occasionally productive.  States that his primary care provider called in some Zofran and some other medication for flu, but patient unsure what the medication is.   Home Medications Prior to Admission medications   Medication Sig Start Date End Date Taking? Authorizing Provider  albuterol (PROVENTIL) (2.5 MG/3ML) 0.083% nebulizer solution Take 2.5 mg by nebulization every 6 (six) hours as needed for wheezing or shortness of breath.    [provider]  albuterol (VENTOLIN HFA) 108 (90 Base) MCG/ACT inhaler INHALE 2 PUFFS BY MOUTH EVERY 6 HOURS AS NEEDED FOR WHEEZING FOR SHORTNESS OF BREATH 02/27/22   Anabel Halon, MD  alprazolam Prudy Feeler) 2 MG tablet Take by mouth.    [provider]  budesonide-formoterol (SYMBICORT) 160-4.5 MCG/ACT inhaler Inhale 2 puffs into the lungs 2 (two) times daily. 01/08/22   Anabel Halon, MD  colchicine 0.6 MG tablet One by mouth three times a day for five days for gout pain. 03/09/23   Darreld Mclean, MD  febuxostat (ULORIC) 40 MG tablet One twice daily for gout 03/09/23   Darreld Mclean, MD  FLUoxetine (PROZAC) 20 MG capsule Take 20 mg by mouth daily.    [provider]  furosemide (LASIX) 20 MG tablet Take 1 tablet (20 mg total) by mouth every other day for 4 doses. 03/14/22 03/21/22  Rondel Baton, MD  gabapentin (NEURONTIN) 300 MG capsule Take 1 capsule (300 mg total) by mouth at bedtime. 10/28/21   Kerri Perches, MD  hydrOXYzine (ATARAX) 50 MG tablet Take 50 mg by mouth daily as needed. 03/09/23   [provider]  hydrOXYzine (VISTARIL) 50 MG capsule Take 100 mg by mouth every 8 (eight) hours as needed. 01/20/21   [provider]  ondansetron (ZOFRAN-ODT) 4 MG disintegrating tablet Take 4 mg by mouth every 8 (eight) hours as needed. 03/03/22   [provider]  oxyCODONE-acetaminophen (PERCOCET/ROXICET) 5-325 MG tablet One tablet every six hours for pain. 07/20/23   Darreld Mclean, MD  predniSONE (STERAPRED UNI-PAK 21 TAB) 5 MG (21) TBPK tablet Take 6 pills first day; 5 pills second day; 4 pills third day; 3 pills fourth day; 2 pills next day and 1 pill last day. 06/23/23   Darreld Mclean, MD  ziprasidone (GEODON) 60 MG capsule Take 60 mg by mouth 2 (two) times daily. 01/30/22   [provider]      Allergies    Penicillins    Review of Systems   Review of Systems  Constitutional:  Positive for chills. Negative for appetite change and fever.  HENT:  Negative for congestion and sore throat.   Respiratory:  Positive for cough. Negative for shortness of breath.   Cardiovascular:  Positive for chest pain.  Gastrointestinal:  Positive for abdominal pain, diarrhea and vomiting.  Genitourinary:  Negative for  decreased urine volume and dysuria.  Musculoskeletal:  Positive for myalgias. Negative for back pain.  Neurological:  Negative for dizziness, weakness, numbness and headaches.    Physical Exam Updated Vital Signs BP (!) 168/110   Pulse 72   Temp 98.4 F (36.9 C) (Oral)   Resp 13   Ht 6\' 2"  (1.88 m)   Wt (!) 168.3 kg   SpO2 97%   BMI 47.64 kg/m  Physical Exam Vitals and nursing note reviewed.  Constitutional:      General: He is not in acute distress.    Appearance: Normal appearance. He is obese. He is not toxic-appearing.  HENT:      Mouth/Throat:     Mouth: Mucous membranes are moist.  Cardiovascular:     Rate and Rhythm: Normal rate and regular rhythm.     Pulses: Normal pulses.  Pulmonary:     Effort: Pulmonary effort is normal. No respiratory distress.     Breath sounds: No wheezing or rhonchi.  Chest:     Chest wall: No tenderness.  Abdominal:     General: There is no distension.     Palpations: Abdomen is soft.     Tenderness: There is abdominal tenderness.     Comments: Diffuse tenderness to palpation.  No guarding or rebound tenderness.  Abdomen is soft  Musculoskeletal:        General: Normal range of motion.  Skin:    General: Skin is warm.     Capillary Refill: Capillary refill takes less than 2 seconds.  Neurological:     General: No focal deficit present.     Mental Status: He is alert.     Sensory: No sensory deficit.     Motor: No weakness.     ED Results / Procedures / Treatments   Labs (all labs ordered are listed, but only abnormal results are displayed) Labs Reviewed  RESP PANEL BY RT-PCR (RSV, FLU A&B, COVID)  RVPGX2 - Abnormal; Notable for the following components:      Result Value   Influenza A by PCR POSITIVE (*)    All other components within normal limits  LIPASE, BLOOD - Abnormal; Notable for the following components:   Lipase 62 (*)    All other components within normal limits  COMPREHENSIVE METABOLIC PANEL - Abnormal; Notable for the following components:   Sodium 133 (*)    Chloride 93 (*)    Glucose, Bld 142 (*)    Calcium 8.7 (*)    Total Protein 8.2 (*)    ALT 45 (*)    All other components within normal limits  CBC - Abnormal; Notable for the following components:   RBC 5.94 (*)    Hemoglobin 18.1 (*)    HCT 53.4 (*)    All other components within normal limits  URINALYSIS, ROUTINE W REFLEX MICROSCOPIC  TROPONIN I (HIGH SENSITIVITY)  TROPONIN I (HIGH SENSITIVITY)    EKG None  Radiology CT ABDOMEN PELVIS W CONTRAST Result Date: 07/28/2023 CLINICAL  DATA:  Abdominal pain, acute, nonlocalized vomiting. EXAM: CT ABDOMEN AND PELVIS WITH CONTRAST TECHNIQUE: Multidetector CT imaging of the abdomen and pelvis was performed using the standard protocol following bolus administration of intravenous contrast. RADIATION DOSE REDUCTION: This exam was performed according to the departmental dose-optimization program which includes automated exposure control, adjustment of the mA and/or kV according to patient size and/or use of iterative reconstruction technique. CONTRAST:  OMNIPAQUE IOHEXOL 300 MG/ML  SOLN COMPARISON:  CT scan abdomen and  pelvis from 08/23/2021. FINDINGS: Lower chest: The lung bases are clear. No pleural effusion. The heart is normal in size. No pericardial effusion. Hepatobiliary: The liver is normal in size. Non-cirrhotic configuration. No suspicious mass. These is mild diffuse hepatic steatosis. No intrahepatic or extrahepatic bile duct dilation. No calcified gallstones. Normal gallbladder wall thickness. No pericholecystic inflammatory changes. Pancreas: Unremarkable. No pancreatic ductal dilatation or surrounding inflammatory changes. Spleen: Within normal limits. No focal lesion. Adrenals/Urinary Tract: Adrenal glands are unremarkable. No suspicious renal mass. No hydronephrosis. No renal or ureteric calculi. Unremarkable urinary bladder. Stomach/Bowel: No disproportionate dilation of the small or large bowel loops. No evidence of abnormal bowel wall thickening or inflammatory changes. The appendix is unremarkable. There are scattered diverticula throughout the colon, without imaging signs of diverticulitis. Vascular/Lymphatic: No ascites or pneumoperitoneum. No abdominal or pelvic lymphadenopathy, by size criteria. No aneurysmal dilation of the major abdominal arteries. Reproductive: Normal size prostate. Symmetric seminal vesicles. Other: There is a tiny fat containing umbilical hernia. The soft tissues and abdominal wall are otherwise  unremarkable. Musculoskeletal: No suspicious osseous lesions. There are mild multilevel degenerative changes in the visualized spine. IMPRESSION: *No acute inflammatory process identified within the abdomen or pelvis. *Multiple other nonacute observations, as described above. Electronically Signed   By: Jules Schick M.D.   On: 07/28/2023 15:18   DG Chest 2 View Result Date: 07/28/2023 CLINICAL DATA:  Generalized body aches. EXAM: CHEST - 2 VIEW COMPARISON:  Radiograph 02/22/2023 FINDINGS: Normal cardiac and mediastinal contours. Low lung volumes. No large area pulmonary consolidation. No pleural effusion or pneumothorax. Thoracic spine degenerative changes. IMPRESSION: Low lung volumes. No acute cardiopulmonary process. Electronically Signed   By: Annia Belt M.D.   On: 07/28/2023 12:43    Procedures Procedures    Medications Ordered in ED Medications  ipratropium-albuterol (DUONEB) 0.5-2.5 (3) MG/3ML nebulizer solution 3 mL (3 mLs Nebulization Given 07/28/23 1245)  benzonatate (TESSALON) capsule 200 mg (200 mg Oral Given 07/28/23 1227)  ondansetron (ZOFRAN-ODT) disintegrating tablet 8 mg (8 mg Oral Given 07/28/23 1227)  iohexol (OMNIPAQUE) 300 MG/ML solution 100 mL (100 mLs Intravenous Contrast Given 07/28/23 1448)  albuterol (VENTOLIN HFA) 108 (90 Base) MCG/ACT inhaler 2 puff (2 puffs Inhalation Given 07/28/23 1521)    ED Course/ Medical Decision Making/ A&P                                 Medical Decision Making Patient here with flulike symptoms and abdominal pain for several days.  Reports vomiting unable to keep down fluids.  Requesting IV fluids, stating that he feels dehydrated.  Abdomen soft no guarding or rebound on exam.  Mucous membranes are moist.  On review of medical records, patient was seen at [facility] ED yesterday for similar symptoms, but he did not state this and his initial medical history.  Review of record patient was reportedly left AMA but patient states "I was kicked  out"  Clinically, I have low suspicion for acute surgical abdomen.  Symptoms likely related to influenza.  Do not appreciate any exam findings that is suggestive of dehydration.  There is no active vomiting during ER stay.  Diverticulitis, colitis, gastroenteritis all considered.  Some chest tenderness as well.  Doubt ACS or PE.  Will obtain labs, CT abdomen and pelvis and chest x-ray for further evaluation.   Amount and/or Complexity of Data Reviewed Labs: ordered.    Details: Labs interpreted by me no evidence  of leukocytosis, chemistries show sodium of 133.  Kidney functions reassuring.  LFTs without significant derangement.  Lipase is mildly elevated at 62.  Delta troponin flat.  Respiratory panel positive for flu A Radiology: ordered.    Details: Chest x-ray without acute cardiopulmonary process  CT abdomen and pelvis without acute process of the abdomen or pelvis.  ECG/medicine tests: ordered.    Details: EKG shows sinus rhythm Discussion of management or test interpretation with external provider(s):   Patient here with cough, chest pain, and abdominal symptoms likely secondary to influenza.  No vomiting during ER stay.  He is tolerated oral fluids here.  Sleeping but easily aroused when entering the room.  Patient does not appear toxic or lethargic.  Vital signs are reassuring.  Hypertensive  I called patient's pharmacy of record, no reported antihypertensive medications have been prescribed.  Patient appears appropriate for discharge home, will address his hypertension with amlodipine.  He has antiemetic at home, prescription written for [facility] for his cough and he was dispensed albuterol inhaler for home use.  I have recommended close outpatient follow-up later this week.  Return precautions were also given.  Risk Prescription drug management.           Final Clinical Impression(s) / ED Diagnoses Final diagnoses:  Influenza  Hypertension, unspecified type     Rx / DC Orders ED Discharge Orders     None         Pauline Aus, PA-C 07/28/23 1632    Sloan Leiter, DO 07/28/23 2114

## 2023-07-28 NOTE — ED Notes (Signed)
 Respiratory called for neb tx

## 2023-07-28 NOTE — ED Triage Notes (Signed)
 Pt c/o generalized body aches and abdominal pain for the last 3 days  Pt states he has been vomiting

## 2023-07-28 NOTE — Progress Notes (Signed)
 I have the flu  He has had flu symptoms for several days.  He went to the ER yesterday and plans to go there again today after leaving here.  He is afebrile but feels cold and is sweating.    His gout is well controlled.  He is taking his medicine.  He has no joint pains today. ROM is good.  Encounter Diagnoses  Name Primary?   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  Call if any problem.  Precautions discussed.  Electronically Signed Darreld Mclean, MD 2/19/20259:27 AM

## 2023-08-18 ENCOUNTER — Telehealth: Payer: Self-pay | Admitting: Orthopaedic Surgery

## 2023-08-18 MED ORDER — OXYCODONE-ACETAMINOPHEN 5-325 MG PO TABS: ORAL_TABLET | ORAL | 0 refills | Status: DC

## 2023-08-18 NOTE — Telephone Encounter (Signed)
 Dr. Sanjuan Dame pt - spoke w/the pt, he is requesting a refill for Oxycodone 5-325 and Prednisone 5mg  to be sent to [facility]

## 2023-08-26 ENCOUNTER — Other Ambulatory Visit: Payer: Self-pay | Admitting: Orthopaedic Surgery

## 2023-09-06 ENCOUNTER — Telehealth: Payer: Self-pay | Admitting: Orthopaedic Surgery

## 2023-09-06 NOTE — Telephone Encounter (Signed)
 Dr. Sanjuan Dame pt - spoke w/the pt, he is requesting a refill for Oxycodone 5-325, 110 tablets, One tablet every six hours for pain to be sent to [facility]

## 2023-09-08 NOTE — Telephone Encounter (Signed)
 Lvm to pt letting him know its too early for refill not due until 04/12

## 2023-09-13 ENCOUNTER — Telehealth: Payer: Self-pay | Admitting: Orthopaedic Surgery

## 2023-09-13 MED ORDER — PREDNISONE 5 MG (21) PO TBPK: ORAL_TABLET | ORAL | 0 refills | Status: DC

## 2023-09-13 MED ORDER — OXYCODONE-ACETAMINOPHEN 5-325 MG PO TABS: ORAL_TABLET | ORAL | 0 refills | Status: DC

## 2023-09-13 NOTE — Telephone Encounter (Signed)
 Dr. Sanjuan Dame pt - pt lvm requesting a refill for Oxycodone 5-325 and Prednisone pak 21 5mg  to be sent to [facility]

## 2023-09-24 ENCOUNTER — Telehealth: Payer: Self-pay | Admitting: Orthopaedic Surgery

## 2023-10-12 ENCOUNTER — Telehealth: Payer: Self-pay | Admitting: Orthopaedic Surgery

## 2023-10-12 MED ORDER — OXYCODONE-ACETAMINOPHEN 5-325 MG PO TABS
ORAL_TABLET | ORAL | 0 refills | Status: DC
Start: 2023-10-12 — End: 2023-11-10

## 2023-10-12 NOTE — Telephone Encounter (Signed)
 Dr. Vicente Graham pt - pt lvm requesting a refill for Prednisone  5mg  and Oxycodone  5-325 to be sent to [facility] [place].

## 2023-10-27 ENCOUNTER — Ambulatory Visit: Payer: Medicare Other | Admitting: Orthopaedic Surgery

## 2023-11-03 ENCOUNTER — Other Ambulatory Visit: Payer: Self-pay | Admitting: Orthopaedic Surgery

## 2023-11-03 NOTE — Telephone Encounter (Signed)
 Dr. Vicente Graham pt - spoke w/the pt, he is requesting a refill for Oxycodone  5-325 to be sent to [facility] in [place]

## 2023-11-10 ENCOUNTER — Ambulatory Visit: Admitting: Orthopaedic Surgery

## 2023-11-10 ENCOUNTER — Telehealth: Payer: Self-pay | Admitting: Orthopaedic Surgery

## 2023-11-10 NOTE — Addendum Note (Signed)
 Addended by: [place] Snow T on: 11/10/2023 11:28 AM   Modules accepted: Orders

## 2023-11-10 NOTE — Telephone Encounter (Signed)
 Dr. Sanjuan Dame pt - spoke w/the pt, he is requesting a refill for Oxycodone 5-325 and Prednisone 5mg  to be sent to [facility]

## 2023-11-11 MED ORDER — OXYCODONE-ACETAMINOPHEN 5-325 MG PO TABS: ORAL_TABLET | ORAL | 0 refills | Status: DC

## 2023-11-17 ENCOUNTER — Ambulatory Visit: Admitting: Orthopaedic Surgery

## 2023-11-17 ENCOUNTER — Encounter: Payer: Self-pay | Admitting: Orthopaedic Surgery

## 2023-11-17 VITALS — BP 153/85 | Ht 74.0 in | Wt 391.0 lb

## 2023-11-17 DIAGNOSIS — M2550 Pain in unspecified joint: Secondary | ICD-10-CM

## 2023-11-17 DIAGNOSIS — M1A09X0 Idiopathic chronic gout, multiple sites, without tophus (tophi): Secondary | ICD-10-CM | POA: Diagnosis not present

## 2023-11-17 DIAGNOSIS — Z6841 Body Mass Index (BMI) 40.0 and over, adult: Secondary | ICD-10-CM

## 2023-11-17 MED ORDER — PREDNISONE 5 MG (21) PO TBPK: ORAL_TABLET | ORAL | 0 refills | Status: DC

## 2023-11-17 NOTE — Progress Notes (Signed)
 I am doing OK.  His gout is well controlled.  He is taking his medicine. He wants a dose pack to have just in case.  He has no recent attacks.  His joints are doing well, no swelling, no redness.  Gait is good.  NV intact.  Encounter Diagnoses  Name Primary?   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  I will give dose pack to hold.  Continue present medications.  Call if any problem.  Precautions discussed.  Electronically Signed Pleasant Brilliant, MD 6/11/20259:48 AM

## 2023-12-07 ENCOUNTER — Telehealth: Payer: Self-pay | Admitting: Radiology

## 2023-12-07 MED ORDER — OXYCODONE-ACETAMINOPHEN 5-325 MG PO TABS: ORAL_TABLET | ORAL | 0 refills | Status: DC

## 2023-12-07 NOTE — Telephone Encounter (Signed)
 Oxycodone -Acetaminophen  5/325 MG  Qty 110 Tablets  PATIENT USES [facility]

## 2023-12-07 NOTE — Telephone Encounter (Signed)
 Patient called LMVM saying he needed a refill of meds, and to please call him.

## 2024-01-04 ENCOUNTER — Telehealth: Payer: Self-pay | Admitting: Orthopaedic Surgery

## 2024-01-04 NOTE — Telephone Encounter (Signed)
 Pt called for refill oxycodone . Please send to [facility]. Pt phone number is [phone]

## 2024-01-05 MED ORDER — OXYCODONE-ACETAMINOPHEN 5-325 MG PO TABS
ORAL_TABLET | ORAL | 0 refills | Status: DC
Start: 2024-01-05 — End: 2024-02-01

## 2024-01-28 ENCOUNTER — Encounter: Payer: Self-pay | Admitting: Radiology

## 2024-02-01 ENCOUNTER — Telehealth: Payer: Self-pay | Admitting: Orthopaedic Surgery

## 2024-02-01 MED ORDER — OXYCODONE-ACETAMINOPHEN 5-325 MG PO TABS: ORAL_TABLET | ORAL | 0 refills | Status: DC

## 2024-02-01 NOTE — Telephone Encounter (Signed)
 Dr. Janae pt - spoke w/the pt, he is requesting a refill for Oxycodone  5-325 to be sent to [facility] [place]

## 2024-02-16 ENCOUNTER — Ambulatory Visit: Admitting: Orthopaedic Surgery

## 2024-02-16 ENCOUNTER — Encounter: Payer: Self-pay | Admitting: Orthopaedic Surgery

## 2024-02-16 DIAGNOSIS — M1A09X0 Idiopathic chronic gout, multiple sites, without tophus (tophi): Secondary | ICD-10-CM

## 2024-02-16 DIAGNOSIS — Z6841 Body Mass Index (BMI) 40.0 and over, adult: Secondary | ICD-10-CM | POA: Diagnosis not present

## 2024-02-16 NOTE — Progress Notes (Signed)
 I had a flare up three weeks ago.  He had flare up of gout in the left wrist about three weeks ago.  He is taking his Uloric .  The allopurinol  did not help previously and he was switched to Uloric .  He had wisdom tooth pulled.  He has no new trauma.  Left wrist with full ROM today, no redness, NV intact.  Encounter Diagnoses  Name Primary?   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  He will consider.  Continue the Uloric .  I have informed the patient I will be retiring from medical practice and from this office on March 09, 2024.  The patient has been offered continuing care with Dr. Margrette or Dr. Onesimo of this office.  The patient may choose another provider and the records will be forwarded after proper signature and notification.  Patient understands and agrees.  Call if any problem.  Precautions discussed.  Return in three months.  Electronically Signed Lemond Stable, MD 9/10/20259:37 AM

## 2024-02-28 ENCOUNTER — Telehealth: Payer: Self-pay | Admitting: Orthopaedic Surgery

## 2024-02-28 NOTE — Telephone Encounter (Signed)
 Dr. Janae pt - pt lvm requesting a refill for Oxycodone  5-325 and Prednisone  5mg  to be sent to [facility]

## 2024-02-29 MED ORDER — PREDNISONE 5 MG (21) PO TBPK: ORAL_TABLET | ORAL | 0 refills | Status: DC

## 2024-02-29 MED ORDER — OXYCODONE-ACETAMINOPHEN 5-325 MG PO TABS: ORAL_TABLET | ORAL | 0 refills | Status: AC

## 2024-02-29 NOTE — Telephone Encounter (Signed)
 Dr. Janae pt - the pt is calling back wanting a refill for the Prednisone  as well.

## 2024-02-29 NOTE — Addendum Note (Signed)
 Addended by: MARCINE HUSBAND T on: 02/29/2024 12:42 PM   Modules accepted: Orders

## 2024-02-29 NOTE — Addendum Note (Signed)
 Addended by: BRENNA NORLEEN ORN on: 02/29/2024 05:01 PM   Modules accepted: Orders

## 2024-03-23 ENCOUNTER — Encounter (HOSPITAL_COMMUNITY): Payer: Self-pay

## 2024-03-23 ENCOUNTER — Emergency Department (HOSPITAL_COMMUNITY)
Admission: EM | Admit: 2024-03-23 | Discharge: 2024-03-23 | Disposition: A | Discharge status: Home / Self Care | Attending: Emergency Medicine | Admitting: Emergency Medicine

## 2024-03-23 ENCOUNTER — Other Ambulatory Visit: Payer: Self-pay

## 2024-03-23 DIAGNOSIS — R112 Nausea with vomiting, unspecified: Secondary | ICD-10-CM | POA: Insufficient documentation

## 2024-03-23 DIAGNOSIS — D72829 Elevated white blood cell count, unspecified: Secondary | ICD-10-CM | POA: Insufficient documentation

## 2024-03-23 DIAGNOSIS — R197 Diarrhea, unspecified: Secondary | ICD-10-CM | POA: Diagnosis not present

## 2024-03-23 DIAGNOSIS — R1084 Generalized abdominal pain: Secondary | ICD-10-CM | POA: Diagnosis not present

## 2024-03-23 LAB — CBC WITH DIFFERENTIAL/PLATELET
Abs Immature Granulocytes: 0.05 K/uL (ref 0.00–0.07)
Basophils Absolute: 0 K/uL (ref 0.0–0.1)
Basophils Relative: 0 %
Eosinophils Absolute: 0 K/uL (ref 0.0–0.5)
Eosinophils Relative: 0 %
HCT: 46.8 % (ref 39.0–52.0)
Hemoglobin: 15.7 g/dL (ref 13.0–17.0)
Immature Granulocytes: 0 %
Lymphocytes Relative: 9 %
Lymphs Abs: 1.1 K/uL (ref 0.7–4.0)
MCH: 30.1 pg (ref 26.0–34.0)
MCHC: 33.5 g/dL (ref 30.0–36.0)
MCV: 89.7 fL (ref 80.0–100.0)
Monocytes Absolute: 0.6 K/uL (ref 0.1–1.0)
Monocytes Relative: 5 %
Neutro Abs: 10.5 K/uL — ABNORMAL HIGH (ref 1.7–7.7)
Neutrophils Relative %: 86 %
Platelets: 317 K/uL (ref 150–400)
RBC: 5.22 MIL/uL (ref 4.22–5.81)
RDW: 13 % (ref 11.5–15.5)
WBC: 12.2 K/uL — ABNORMAL HIGH (ref 4.0–10.5)
nRBC: 0 % (ref 0.0–0.2)

## 2024-03-23 LAB — COMPREHENSIVE METABOLIC PANEL WITH GFR
ALT: 34 U/L (ref 0–44)
AST: 28 U/L (ref 15–41)
Albumin: 4.2 g/dL (ref 3.5–5.0)
Alkaline Phosphatase: 112 U/L (ref 38–126)
Anion gap: 13 (ref 5–15)
BUN: 15 mg/dL (ref 6–20)
CO2: 25 mmol/L (ref 22–32)
Calcium: 9.3 mg/dL (ref 8.9–10.3)
Chloride: 99 mmol/L (ref 98–111)
Creatinine, Ser: 0.81 mg/dL (ref 0.61–1.24)
GFR, Estimated: >60 mL/min (ref >60)
Glucose, Bld: 156 mg/dL — ABNORMAL HIGH (ref 70–99)
Potassium: 3.9 mmol/L (ref 3.5–5.1)
Sodium: 136 mmol/L (ref 135–145)
Total Bilirubin: 0.4 mg/dL (ref 0.0–1.2)
Total Protein: 8.1 g/dL (ref 6.5–8.1)

## 2024-03-23 LAB — LIPASE, BLOOD: Lipase: 24 U/L (ref 11–51)

## 2024-03-23 MED ORDER — SODIUM CHLORIDE 0.9 % IV BOLUS
1000.0000 mL | Freq: Once | INTRAVENOUS | Status: AC
  Administered 2024-03-23: 1000 mL via INTRAVENOUS

## 2024-03-23 MED ORDER — ONDANSETRON HCL 4 MG/2ML IJ SOLN
4.0000 mg | Freq: Once | INTRAMUSCULAR | Status: AC
  Administered 2024-03-23: 4 mg via INTRAVENOUS
  Filled 2024-03-23: qty 2

## 2024-03-23 MED ORDER — MORPHINE SULFATE (PF) 4 MG/ML IV SOLN
4.0000 mg | Freq: Once | INTRAVENOUS | Status: AC
  Administered 2024-03-23: 4 mg via INTRAVENOUS
  Filled 2024-03-23: qty 1

## 2024-03-23 MED ORDER — ONDANSETRON 4 MG PO TBDP: 4.0000 mg | ORAL_TABLET | Freq: Three times a day (TID) | ORAL | 0 refills | Status: AC | PRN

## 2024-03-23 NOTE — ED Triage Notes (Signed)
 Pov from home. Cc of abdominal pain/spasms with n/v (x15) /d (x5) since yesterday.  10/10 pain.

## 2024-03-23 NOTE — ED Provider Notes (Signed)
 [place] EMERGENCY DEPARTMENT AT [facility]  Provider Note  CSN: [id number] Arrival date & time: 03/23/24 9780  History Chief Complaint  Patient presents with   Abdominal Pain    Martin Bruce is a 48 y.o. male with history of schizoaffective disorder and anxiety (on Geodon  and Xanax ) as well as gout/chronic pain (on percocet QID) presents for 1 day of nausea, vomiting, abdominal and back cramping and generally feeling ill. He has not had fever. He has been out of his percocet for the last few days; previously was being managed by Dr. Brenna who has retired since last Rx filled on 9/23; he is not sure who he will be seeing for long term management now.    Home Medications Prior to Admission medications   Medication Sig Start Date End Date Taking? Authorizing Provider  ondansetron  (ZOFRAN -ODT) 4 MG disintegrating tablet Take 1 tablet (4 mg total) by mouth every 8 (eight) hours as needed for nausea or vomiting. 03/23/24  Yes Roselyn Carlin NOVAK, MD  albuterol  (PROVENTIL ) (2.5 MG/3ML) 0.083% nebulizer solution Take 2.5 mg by nebulization every 6 (six) hours as needed for wheezing or shortness of breath.    [provider]  albuterol  (VENTOLIN  HFA) 108 (90 Base) MCG/ACT inhaler INHALE 2 PUFFS BY MOUTH EVERY 6 HOURS AS NEEDED FOR WHEEZING FOR SHORTNESS OF BREATH 02/27/22   Tobie Suzzane POUR, MD  alprazolam  (XANAX ) 2 MG tablet Take by mouth.    [provider]  amLODipine  (NORVASC ) 5 MG tablet Take 1 tablet (5 mg total) by mouth daily. 07/28/23   Triplett, Tammy, PA-C  benzonatate  (TESSALON ) 200 MG capsule Take 1 capsule (200 mg total) by mouth 3 (three) times daily as needed. 07/28/23   Triplett, Tammy, PA-C  budesonide -formoterol  (SYMBICORT ) 160-4.5 MCG/ACT inhaler Inhale 2 puffs into the lungs 2 (two) times daily. 01/08/22   Tobie Suzzane POUR, MD  colchicine  0.6 MG tablet One by mouth three times a day for five days for gout pain. 03/09/23   Brenna Lin, MD   febuxostat  (ULORIC ) 40 MG tablet TAKE 1 TABLET BY MOUTH TWICE DAILY FOR GOUT 09/27/23   Brenna Lin, MD  FLUoxetine  (PROZAC ) 20 MG capsule Take 20 mg by mouth daily.    [provider]  furosemide  (LASIX ) 20 MG tablet Take 1 tablet (20 mg total) by mouth every other day for 4 doses. 03/14/22 02/16/24  Yolande Lamar BROCKS, MD  gabapentin  (NEURONTIN ) 300 MG capsule Take 1 capsule (300 mg total) by mouth at bedtime. 10/28/21   Antonetta Rollene BRAVO, MD  hydrOXYzine  (ATARAX ) 50 MG tablet Take 50 mg by mouth daily as needed. 03/09/23   [provider]  oxyCODONE -acetaminophen  (PERCOCET/ROXICET) 5-325 MG tablet One tablet every six hours for pain. 02/29/24   Brenna Lin, MD  ziprasidone  (GEODON ) 60 MG capsule Take 60 mg by mouth 2 (two) times daily. 01/30/22   [provider]     Allergies    Penicillins   Review of Systems   Review of Systems Please see HPI for pertinent positives and negatives  Physical Exam BP (!) 153/83   Pulse 66   Temp 98.1 F (36.7 C) (Oral)   Resp 18   Ht 6' 2 (1.88 m)   Wt (!) 172.4 kg   SpO2 99%   BMI 48.79 kg/m   Physical Exam Vitals and nursing note reviewed.  Constitutional:      Appearance: Normal appearance.  HENT:     Head: Normocephalic and atraumatic.  Nose: Nose normal.     Mouth/Throat:     Mouth: Mucous membranes are moist.  Eyes:     Extraocular Movements: Extraocular movements intact.     Conjunctiva/sclera: Conjunctivae normal.  Cardiovascular:     Rate and Rhythm: Normal rate.  Pulmonary:     Effort: Pulmonary effort is normal.     Breath sounds: Normal breath sounds.  Abdominal:     General: Abdomen is flat.     Palpations: Abdomen is soft.     Tenderness: There is no abdominal tenderness. There is no guarding. Negative signs include Murphy's sign and McBurney's sign.  Musculoskeletal:        General: No swelling. Normal range of motion.     Cervical back: Neck supple.  Skin:    General: Skin is  warm and dry.  Neurological:     General: No focal deficit present.     Mental Status: He is alert.  Psychiatric:        Mood and Affect: Mood normal.     ED Results / Procedures / Treatments   EKG None  Procedures Procedures  Medications Ordered in the ED Medications  morphine  (PF) 4 MG/ML injection 4 mg (4 mg Intravenous Given 03/23/24 0249)  ondansetron  (ZOFRAN ) injection 4 mg (4 mg Intravenous Given 03/23/24 0249)  sodium chloride  0.9 % bolus 1,000 mL (1,000 mLs Intravenous New Bag/Given 03/23/24 0249)    Initial Impression and Plan  Patient here with N/V/D and abdominal cramping. Exam is benign, vitals are reassuring. May be a viral process, he just returned from a trip to [place] , but also suspect some degree of opioid withdrawal. Will check labs, give pain/nausea meds and IVF for comfort.   ED Course   Clinical Course as of 03/23/24 0400  Thu Mar 23, 2024  0321 CBC with mild leukocytosis, otherwise unremarkable.  [CS]  0340 CMP and lipase are unremarkable.  [CS]  [id number] Patient sleeping soundly on re-evaluation. No vomiting since arrival. Abdomen remains benign. Plan discharge with Rx for Zofran , recommend outpatient PCP and Pain follow up. RTED for any other concerns.  [CS]    Clinical Course User Index [CS] Roselyn Carlin NOVAK, MD     MDM Rules/Calculators/A&P Medical Decision Making Problems Addressed: Generalized abdominal pain: acute illness or injury Nausea vomiting and diarrhea: acute illness or injury  Amount and/or Complexity of Data Reviewed Labs: ordered. Decision-making details documented in ED Course.  Risk Prescription drug management. Parenteral controlled substances.     Final Clinical Impression(s) / ED Diagnoses Final diagnoses:  Nausea vomiting and diarrhea  Generalized abdominal pain    Rx / DC Orders ED Discharge Orders          Ordered    ondansetron  (ZOFRAN -ODT) 4 MG disintegrating tablet  Every 8 hours PRN         03/23/24 0359             Roselyn Carlin NOVAK, MD 03/23/24 0400

## 2024-03-25 ENCOUNTER — Emergency Department (HOSPITAL_COMMUNITY)

## 2024-03-25 ENCOUNTER — Emergency Department (HOSPITAL_COMMUNITY)
Admission: EM | Admit: 2024-03-25 | Discharge: 2024-03-25 | Disposition: A | Discharge status: Home / Self Care | Attending: Emergency Medicine | Admitting: Emergency Medicine

## 2024-03-25 ENCOUNTER — Other Ambulatory Visit: Payer: Self-pay

## 2024-03-25 DIAGNOSIS — R1012 Left upper quadrant pain: Secondary | ICD-10-CM | POA: Insufficient documentation

## 2024-03-25 DIAGNOSIS — E871 Hypo-osmolality and hyponatremia: Secondary | ICD-10-CM | POA: Diagnosis not present

## 2024-03-25 DIAGNOSIS — R7401 Elevation of levels of liver transaminase levels: Secondary | ICD-10-CM | POA: Diagnosis not present

## 2024-03-25 DIAGNOSIS — R1013 Epigastric pain: Secondary | ICD-10-CM

## 2024-03-25 DIAGNOSIS — R109 Unspecified abdominal pain: Secondary | ICD-10-CM | POA: Diagnosis present

## 2024-03-25 DIAGNOSIS — R1032 Left lower quadrant pain: Secondary | ICD-10-CM | POA: Insufficient documentation

## 2024-03-25 DIAGNOSIS — E876 Hypokalemia: Secondary | ICD-10-CM | POA: Insufficient documentation

## 2024-03-25 DIAGNOSIS — E860 Dehydration: Secondary | ICD-10-CM

## 2024-03-25 LAB — COMPREHENSIVE METABOLIC PANEL WITH GFR
ALT: 137 U/L — ABNORMAL HIGH (ref 0–44)
AST: 81 U/L — ABNORMAL HIGH (ref 15–41)
Albumin: 4.2 g/dL (ref 3.5–5.0)
Alkaline Phosphatase: 124 U/L (ref 38–126)
Anion gap: 13 (ref 5–15)
BUN: 12 mg/dL (ref 6–20)
CO2: 27 mmol/L (ref 22–32)
Calcium: 9 mg/dL (ref 8.9–10.3)
Chloride: 94 mmol/L — ABNORMAL LOW (ref 98–111)
Creatinine, Ser: 1.19 mg/dL (ref 0.61–1.24)
GFR, Estimated: >60 mL/min (ref >60)
Glucose, Bld: 136 mg/dL — ABNORMAL HIGH (ref 70–99)
Potassium: 3.4 mmol/L — ABNORMAL LOW (ref 3.5–5.1)
Sodium: 134 mmol/L — ABNORMAL LOW (ref 135–145)
Total Bilirubin: 1 mg/dL (ref 0.0–1.2)
Total Protein: 8.1 g/dL (ref 6.5–8.1)

## 2024-03-25 LAB — CBC WITH DIFFERENTIAL/PLATELET
Abs Immature Granulocytes: 0.02 K/uL (ref 0.00–0.07)
Basophils Absolute: 0.1 K/uL (ref 0.0–0.1)
Basophils Relative: 1 %
Eosinophils Absolute: 0 K/uL (ref 0.0–0.5)
Eosinophils Relative: 0 %
HCT: 50 % (ref 39.0–52.0)
Hemoglobin: 17.2 g/dL — ABNORMAL HIGH (ref 13.0–17.0)
Immature Granulocytes: 0 %
Lymphocytes Relative: 31 %
Lymphs Abs: 2.6 K/uL (ref 0.7–4.0)
MCH: 30.1 pg (ref 26.0–34.0)
MCHC: 34.4 g/dL (ref 30.0–36.0)
MCV: 87.4 fL (ref 80.0–100.0)
Monocytes Absolute: 0.8 K/uL (ref 0.1–1.0)
Monocytes Relative: 9 %
Neutro Abs: 5 K/uL (ref 1.7–7.7)
Neutrophils Relative %: 59 %
Platelets: 330 K/uL (ref 150–400)
RBC: 5.72 MIL/uL (ref 4.22–5.81)
RDW: 12.5 % (ref 11.5–15.5)
WBC: 8.4 K/uL (ref 4.0–10.5)
nRBC: 0 % (ref 0.0–0.2)

## 2024-03-25 LAB — URINALYSIS, ROUTINE W REFLEX MICROSCOPIC
Bilirubin Urine: NEGATIVE
Glucose, UA: NEGATIVE mg/dL
Hgb urine dipstick: NEGATIVE
Ketones, ur: 5 mg/dL — AB
Leukocytes,Ua: NEGATIVE
Nitrite: NEGATIVE
Protein, ur: NEGATIVE mg/dL
Specific Gravity, Urine: >1.046 — ABNORMAL HIGH (ref 1.005–1.030)
pH: 6 (ref 5.0–8.0)

## 2024-03-25 LAB — LIPASE, BLOOD: Lipase: 26 U/L (ref 11–51)

## 2024-03-25 MED ORDER — DICYCLOMINE HCL 10 MG/ML IM SOLN
20.0000 mg | Freq: Once | INTRAMUSCULAR | Status: AC
  Administered 2024-03-25: 20 mg via INTRAMUSCULAR
  Filled 2024-03-25: qty 2

## 2024-03-25 MED ORDER — MORPHINE SULFATE (PF) 4 MG/ML IV SOLN
4.0000 mg | Freq: Once | INTRAVENOUS | Status: AC
  Administered 2024-03-25: 4 mg via INTRAVENOUS
  Filled 2024-03-25: qty 1

## 2024-03-25 MED ORDER — POTASSIUM CHLORIDE CRYS ER 20 MEQ PO TBCR
40.0000 meq | EXTENDED_RELEASE_TABLET | Freq: Once | ORAL | Status: AC
  Administered 2024-03-25: 40 meq via ORAL
  Filled 2024-03-25: qty 2

## 2024-03-25 MED ORDER — ONDANSETRON HCL 4 MG/2ML IJ SOLN
4.0000 mg | Freq: Once | INTRAMUSCULAR | Status: AC
  Administered 2024-03-25: 4 mg via INTRAVENOUS
  Filled 2024-03-25: qty 2

## 2024-03-25 MED ORDER — SODIUM CHLORIDE 0.9 % IV BOLUS
1000.0000 mL | Freq: Once | INTRAVENOUS | Status: AC
  Administered 2024-03-25: 1000 mL via INTRAVENOUS

## 2024-03-25 MED ORDER — ONDANSETRON HCL 4 MG PO TABS: 4.0000 mg | ORAL_TABLET | Freq: Four times a day (QID) | ORAL | 0 refills | Status: AC

## 2024-03-25 MED ORDER — SODIUM CHLORIDE 0.9 % IV BOLUS: 1000.0000 mL | Freq: Once | INTRAVENOUS | Status: DC

## 2024-03-25 MED ADMIN — Iohexol Inj 300 MG/ML: 100 mL | INTRAVENOUS | NDC 00407141363

## 2024-03-25 NOTE — ED Triage Notes (Signed)
 Pt from home complains of episode on nausea and emesis on Monday. Endorses pain in LLQ that started on Thursday. Pt state episode of diarrhea on Wednesday/Thursday and hasn't eaten anything since Monday. Endorses sweating from pain, weakness and fatigue and dizziness. Seen here Wednesday morning for the same symptoms.

## 2024-03-25 NOTE — ED Notes (Signed)
 Pt/family received d/c paperwork at this time. After going over the paperwork any questions, comments, or concerns were answered to the best of this nurse's knowledge. The pt/family verbally acknowledged the teachings/instructions.

## 2024-03-25 NOTE — ED Provider Notes (Signed)
 [place] EMERGENCY DEPARTMENT AT [facility] Provider Note   CSN: [id number] Arrival date & time: 03/25/24  1412     Patient presents with: Emesis and Abdominal Pain   Martin Bruce is a 48 y.o. male.   Patient is a 48 year old male who presents to the emergency department the chief complaint of ongoing abdominal pain, nausea, vomiting, diarrhea since Monday.  Patient was evaluated in the emergency department 2 days ago during which time he was treated symptomatically and discharged home.  Patient notes that he is continue to have ongoing symptoms as well as worsening left sided abdominal pain.  He denies any melena or hematochezia.  He has had no dysuria or hematuria.  He does admit to associated chills at home.  He also admits to associated malaise and fatigue.   Emesis Associated symptoms: abdominal pain   Abdominal Pain Associated symptoms: fatigue and vomiting        Prior to Admission medications   Medication Sig Start Date End Date Taking? Authorizing Provider  albuterol  (PROVENTIL ) (2.5 MG/3ML) 0.083% nebulizer solution Take 2.5 mg by nebulization every 6 (six) hours as needed for wheezing or shortness of breath.    [provider]  albuterol  (VENTOLIN  HFA) 108 (90 Base) MCG/ACT inhaler INHALE 2 PUFFS BY MOUTH EVERY 6 HOURS AS NEEDED FOR WHEEZING FOR SHORTNESS OF BREATH 02/27/22   Tobie Suzzane POUR, MD  alprazolam  (XANAX ) 2 MG tablet Take by mouth.    [provider]  amLODipine  (NORVASC ) 5 MG tablet Take 1 tablet (5 mg total) by mouth daily. 07/28/23   Triplett, Tammy, PA-C  benzonatate  (TESSALON ) 200 MG capsule Take 1 capsule (200 mg total) by mouth 3 (three) times daily as needed. 07/28/23   Triplett, Tammy, PA-C  budesonide -formoterol  (SYMBICORT ) 160-4.5 MCG/ACT inhaler Inhale 2 puffs into the lungs 2 (two) times daily. 01/08/22   Tobie Suzzane POUR, MD  colchicine  0.6 MG tablet One by mouth three times a day for five days for gout pain. 03/09/23    Brenna Lin, MD  febuxostat  (ULORIC ) 40 MG tablet TAKE 1 TABLET BY MOUTH TWICE DAILY FOR GOUT 09/27/23   Brenna Lin, MD  FLUoxetine  (PROZAC ) 20 MG capsule Take 20 mg by mouth daily.    [provider]  furosemide  (LASIX ) 20 MG tablet Take 1 tablet (20 mg total) by mouth every other day for 4 doses. 03/14/22 02/16/24  Yolande Lamar BROCKS, MD  gabapentin  (NEURONTIN ) 300 MG capsule Take 1 capsule (300 mg total) by mouth at bedtime. 10/28/21   Antonetta Rollene BRAVO, MD  hydrOXYzine  (ATARAX ) 50 MG tablet Take 50 mg by mouth daily as needed. 03/09/23   [provider]  ondansetron  (ZOFRAN -ODT) 4 MG disintegrating tablet Take 1 tablet (4 mg total) by mouth every 8 (eight) hours as needed for nausea or vomiting. 03/23/24   Roselyn Carlin NOVAK, MD  oxyCODONE -acetaminophen  (PERCOCET/ROXICET) 5-325 MG tablet One tablet every six hours for pain. 02/29/24   Brenna Lin, MD  ziprasidone  (GEODON ) 60 MG capsule Take 60 mg by mouth 2 (two) times daily. 01/30/22   [provider]    Allergies: Penicillins    Review of Systems  Constitutional:  Positive for fatigue.  Gastrointestinal:  Positive for abdominal pain and vomiting.  All other systems reviewed and are negative.   Updated Vital Signs BP (!) 140/108 (BP Location: Right Arm)   Pulse 83   Temp 98.9 F (37.2 C) (Oral)   Resp 18   Ht 6' 2 (1.88  m)   Wt (!) 172.4 kg   SpO2 99%   BMI 48.79 kg/m   Physical Exam Vitals and nursing note reviewed.  Constitutional:      General: He is not in acute distress.    Appearance: Normal appearance. He is not ill-appearing.  HENT:     Head: Normocephalic and atraumatic.     Nose: Nose normal.     Mouth/Throat:     Mouth: Mucous membranes are moist.  Eyes:     Extraocular Movements: Extraocular movements intact.     Conjunctiva/sclera: Conjunctivae normal.     Pupils: Pupils are equal, round, and reactive to light.  Cardiovascular:     Rate and Rhythm: Normal rate and  regular rhythm.     Pulses: Normal pulses.     Heart sounds: Normal heart sounds. No murmur heard.    No gallop.  Pulmonary:     Effort: Pulmonary effort is normal. No respiratory distress.     Breath sounds: Normal breath sounds. No stridor. No wheezing, rhonchi or rales.  Abdominal:     General: Abdomen is flat. Bowel sounds are normal. There is no distension.     Palpations: Abdomen is soft.     Tenderness: There is abdominal tenderness in the left upper quadrant and left lower quadrant. Negative signs include Murphy's sign and McBurney's sign.     Hernia: No hernia is present.  Musculoskeletal:        General: Normal range of motion.     Cervical back: Normal range of motion and neck supple.  Skin:    General: Skin is warm and dry.  Neurological:     General: No focal deficit present.     Mental Status: He is alert and oriented to person, place, and time. Mental status is at baseline.  Psychiatric:        Mood and Affect: Mood normal.        Behavior: Behavior normal.        Thought Content: Thought content normal.        Judgment: Judgment normal.     (all labs ordered are listed, but only abnormal results are displayed) Labs Reviewed  COMPREHENSIVE METABOLIC PANEL WITH GFR - Abnormal; Notable for the following components:      Result Value   Sodium 134 (*)    Potassium 3.4 (*)    Chloride 94 (*)    Glucose, Bld 136 (*)    AST 81 (*)    ALT 137 (*)    All other components within normal limits  CBC WITH DIFFERENTIAL/PLATELET - Abnormal; Notable for the following components:   Hemoglobin 17.2 (*)    All other components within normal limits  LIPASE, BLOOD  URINALYSIS, ROUTINE W REFLEX MICROSCOPIC    EKG: None  Radiology: No results found.   Procedures   Medications Ordered in the ED  sodium chloride  0.9 % bolus 1,000 mL (1,000 mLs Intravenous New Bag/Given 03/25/24 1540)  morphine  (PF) 4 MG/ML injection 4 mg (4 mg Intravenous Given 03/25/24 1539)   ondansetron  (ZOFRAN ) injection 4 mg (4 mg Intravenous Given 03/25/24 1538)  dicyclomine (BENTYL) injection 20 mg (20 mg Intramuscular Given 03/25/24 1539)  iohexol  (OMNIPAQUE ) 300 MG/ML solution 100 mL (100 mLs Intravenous Contrast Given 03/25/24 1558)                                    Medical Decision Making  Amount and/or Complexity of Data Reviewed Labs: ordered. Radiology: ordered.  Risk Prescription drug management.   This patient presents to the ED for concern of abdominal pain differential diagnosis includes acute appendicitis, cholecystitis, small bowel obstruction, diverticulitis, testicular torsion, pyelonephritis, kidney stone, pancreatitis, mesenteric ischemia    Additional history obtained:  Additional history obtained from medical records External records from outside source obtained and reviewed including medical records   Lab Tests:  I Ordered, and personally interpreted labs.  The pertinent results include: No leukocytosis, no anemia, mild hypokalemia, hyponatremia, mild elevation of AST and ALT, unremarkable creatinine, negative lipase   Imaging Studies ordered:  I ordered imaging studies including see the scan abdomen and pelvis I independently visualized and interpreted imaging which showed no acute intra-abdominal process I agree with the radiologist interpretation   Medicines ordered and prescription drug management:  I ordered medication including IV fluids, morphine , Bentyl, potassium, Zofran  for nausea, vomiting, diarrhea, abdominal pain Reevaluation of the patient after these medicines showed that the patient improved I have reviewed the patients home medicines and have made adjustments as needed   Problem List / ED Course:  Patient is doing very well at this time and is stable for discharge home.  Discussed with patient that our workup in the emergency department has been unremarkable.  CT scan of abdomen pelvis demonstrated no acute  process.  Suspect an acute viral gastroenteritis at this time.  Will continue symptomatic treatment outpatient basis.  Strict turn precautions were discussed for any new or worsening symptoms.  Patient voiced understand the plan and had no additional questions.  Patient was fully evaluated by attending physician who is in agreement to plan at this time.     Social Determinants of Health:  none        Final diagnoses:  None    ED Discharge Orders     None          Daralene Lonni JONETTA DEVONNA 03/25/24 1842    Cleotilde Rogue, MD 03/27/24 [phone]

## 2024-03-25 NOTE — Discharge Instructions (Addendum)
 Your testing was unremarkable and reassuring, take the Zofran  every 6 hours as needed for nausea, drink plenty of clear liquids, avoid alcohol  Thank you for allowing us  to treat you in the emergency department today.  After reviewing your examination and potential testing that was done it appears that you are safe to go home.  I would like for you to follow-up with your doctor within the next several days, have them obtain your records and follow-up with them to review all potential tests and results from your visit.  If you should develop severe or worsening symptoms return to the emergency department immediately

## 2024-04-10 ENCOUNTER — Encounter: Payer: Self-pay | Admitting: Radiology

## 2024-05-09 ENCOUNTER — Other Ambulatory Visit: Payer: Self-pay | Admitting: Orthopedic Surgery

## 2024-05-09 MED ORDER — FEBUXOSTAT 40 MG PO TABS: ORAL_TABLET | ORAL | 5 refills | Status: AC

## 2024-05-09 NOTE — Telephone Encounter (Signed)
 Refill request received via fax for  Uloric  [facility]

## 2024-05-17 ENCOUNTER — Ambulatory Visit: Admitting: Orthopedic Surgery

## 2024-06-19 ENCOUNTER — Encounter: Payer: Self-pay | Admitting: Radiology

## 2024-06-19 ENCOUNTER — Telehealth: Payer: Self-pay | Admitting: Radiology

## 2024-06-19 NOTE — Telephone Encounter (Signed)
 Completed a prior auth of Uloric  via cover my meds

## 2024-06-21 ENCOUNTER — Telehealth: Payer: Self-pay | Admitting: Orthopedic Surgery

## 2024-06-21 NOTE — Telephone Encounter (Signed)
 I called pharmacy They filed on incorrect pharmacy plan He has separate drug coverage  She put it on his pharmacy plan and it went through Without prior authorization, it is done   I called patient phone picked up but no one spoke

## 2024-06-21 NOTE — Telephone Encounter (Signed)
 Dr. Areatha pt - transcript from [facility], this is Alan calling from [facility] in [place]. We have a patient who sees Dr. Margrette. Patient's name is Martin Bruce, dob 08/24/1975. He is on the generic Uloric , and he got a text message on his phone saying that his prior shara was denied. I didn't know if there was additional documentation you guys could send to the insurance to try to get that covered, or if he needs to try and file something. I just wanted to call and let you know so that we can try to move forward with another option. Again, it's for Martin Bruce, dob 01-24-1976 on his generic Uloric . He did get a text on his phone saying the PA was denied. Any questions, give me a call, our phone number is [phone] is our doctor line. Our phone's cut on at 9am. Thank you.
